# Patient Record
Sex: Male | Born: 1970 | Race: White | Hispanic: No | Marital: Married | Smoking: Current every day smoker
Health system: Southern US, Community
[De-identification: ages and names within clinical notes are randomized; demographics above are authoritative.]

## PROBLEM LIST (undated history)

## (undated) DIAGNOSIS — I10 Essential (primary) hypertension: Secondary | ICD-10-CM

## (undated) DIAGNOSIS — E119 Type 2 diabetes mellitus without complications: Secondary | ICD-10-CM

## (undated) HISTORY — PX: KNEE SURGERY: SHX244

## (undated) HISTORY — PX: SHOULDER SURGERY: SHX246

---

## 2002-10-25 ENCOUNTER — Emergency Department (HOSPITAL_COMMUNITY): Admission: EM | Admit: 2002-10-25 | Discharge: 2002-10-25 | Payer: Self-pay | Admitting: Emergency Medicine

## 2004-08-08 ENCOUNTER — Ambulatory Visit: Admission: RE | Admit: 2004-08-08 | Discharge: 2004-08-08 | Payer: Self-pay | Admitting: Family Medicine

## 2004-08-24 ENCOUNTER — Ambulatory Visit: Payer: Self-pay | Admitting: Pulmonary Disease

## 2008-12-29 ENCOUNTER — Ambulatory Visit (HOSPITAL_COMMUNITY): Admission: RE | Admit: 2008-12-29 | Discharge: 2008-12-29 | Payer: Self-pay | Admitting: Orthopaedic Surgery

## 2010-08-12 LAB — CBC
MCHC: 34.4 g/dL (ref 30.0–36.0)
MCV: 97.1 fL (ref 78.0–100.0)
Platelets: 182 10*3/uL (ref 150–400)
RDW: 13.2 % (ref 11.5–15.5)
WBC: 9.5 10*3/uL (ref 4.0–10.5)

## 2010-08-12 LAB — BASIC METABOLIC PANEL
BUN: 11 mg/dL (ref 6–23)
Chloride: 101 mEq/L (ref 96–112)
Creatinine, Ser: 0.86 mg/dL (ref 0.4–1.5)
Glucose, Bld: 93 mg/dL (ref 70–99)

## 2010-09-19 NOTE — Op Note (Signed)
NAME:  Shaun Williams, Shaun Williams            ACCOUNT NO.:  [id number]   MEDICAL RECORD NO.:  [id number]          PATIENT TYPE:  AMB   LOCATION:  SDS                          FACILITY:  [facility]   PHYSICIAN:  Mark C. Ophelia Charter, M.D.    DATE OF BIRTH:  [birth year]   DATE OF PROCEDURE:  12/29/2008  DATE OF DISCHARGE:  12/29/2008                               OPERATIVE REPORT   PREOPERATIVE DIAGNOSIS:  Chondromalacia, left knee with medial meniscal  tear.   POSTOPERATIVE DIAGNOSIS:  Chondromalacia, left knee with medial meniscal  tear.   PROCEDURE:  Diagnostic and operative arthroscopy, left knee,  tricompartmental debridement with partial medial meniscectomy.   SURGEON:  Mark C. Ophelia Charter, MD.   ANESTHESIA:  MOA plus preoperative knee block, MAC.   PROCEDURE:  After induction of anesthesia, proximal thigh leg holder,  standard prepping and draping, leg holder for the opposite leg.  DuraPrep was used.  The usual lower extremity sheets, drapes, impervious  stockinette, Coban, arthroscopic sheet and pads were applied.  Inflow  was placed through superolateral portal.  After time-out of the  procedure was completed, 70 cm pop medial and lateral parapatellar  tendon port was used for scope and probe placement.  Patellofemoral  joint showed some grade 3 changes, no grade 4 on the patella which is  more on the lateral facet than medial.  There were some loose cartilage  pieces floating in the suprapatellar pouch which were debrided with a  Kuda shaver.  Medial and lateral gutters were clear of loose bodies.  ACL showed trace laxity and there were some slight detachments at the  anterior portion of the lateral meniscus and ACL at the anterior portion  of the tibia and it would peel back with some separation.  No  undersurface tear of the lateral meniscus.  There were some fraying on  the edge, which was slightly trimmed.  Knee was flexed and extended and  the lateral compartment was relatively spared.   Patellofemoral joint  showed significant grade 3 changes and a large area involving the  lateral facet and a shaver was introduced with extensive debridement and  smoothing of these areas.  Medial compartment shows extensive damage on  the medial femoral condyle which involve the weightbearing surface  involving the posterior aspect with contact for the knee to flex 90  degrees all the way out into full extension.  There are multiple flap  tears with tense pieces of cartilage which were trimmed back and a  complex tear of the posterior medial meniscus which was trimmed back  with straight small flat baskets followed by the 4.2 shaver, smoothing  the rim.  The remaining rim was intact, anterior portion and midportion  of the medial meniscus was intact.  Tibia showed some grade 2 and some  spotty areas of grade 3 changes with less destruction then on the medial  femoral condyle.  Two worst areas were the trochlear groove and patella,  the patellofemoral joint and then the medial femoral condyle.  After  thorough irrigation, aspiration, lavage of the remaining cartilage  fragments, final pulling on the meniscus, checking  the cartilage with  flexion, extension and making sure there were no remaining flap tears of  the cartilage that all areas have been trimmed and smoothed to make his  joint surface as smooth as possible.  He was suctioned  dried and nylon sutures were placed in the portals.  Xeroform, Marcaine  infiltration into the knee, 4x4s, Webril, 6-inch Ace wrap x2 for  postoperative dressing.  Instrument count and needle count was correct.  Office followup in 1 week.  Outpatient surgery was appropriate for  treatment of this condition.      Mark C. Ophelia Charter, M.D.  Electronically Signed     MCY/MEDQ  D:  12/29/2008  T:  12/30/2008  Job:  [id number]

## 2010-09-22 NOTE — Procedures (Signed)
NAME:  Shaun Williams, Shaun Williams            ACCOUNT NO.:  [id number]   MEDICAL RECORD NO.:  [id number]          PATIENT TYPE:  OUT   LOCATION:  SLEEP LAB                     FACILITY:  [facility]   PHYSICIAN:  Marcelyn Bruins, M.D. [facility] DATE OF BIRTH:  Mar 31, 1971   DATE OF STUDY:  08/08/2004                              NOCTURNAL POLYSOMNOGRAM   DATE OF STUDY:  August 08, 2004   REFERRING PHYSICIAN:  Dr. Lilyan Punt   INDICATION FOR THE STUDY:  Hypersomnia with sleep apnea.   SLEEP ARCHITECTURE:  The patient had a total sleep time of 323 minutes with  significantly decreased REM. Sleep onset latency was normal and REM onset  was very prolonged.   IMPRESSION:  1.  Split-night study reveals severe obstructive sleep apnea with 265      obstructive events noted in the first 147 minutes of sleep. This gave      the patient a respiratory disturbance index of 109 events per hour with      O2 desaturation as low as 81%. Events were not positional. Very loud      snoring was noted during this time. By protocol the patient was placed      on a medium ComfortGel continuous positive airway pressure mask and      continuous positive airway pressure titration was initiated. At a      pressure of 11 cm there appeared to be excellent control of his      obstructive events and also snoring. Given the patient's severe sleep      apnea, continuous positive airway pressure coupled with weight loss      would be his  best treatment option.  2.  No clinically significant cardiac arrhythmias.      KC/MEDQ  D:  08/24/2004 13:45:43  T:  08/24/2004 19:41:35  Job:  [id number]

## 2012-08-28 ENCOUNTER — Emergency Department (HOSPITAL_COMMUNITY)
Admission: EM | Admit: 2012-08-28 | Discharge: 2012-08-29 | Disposition: A | Discharge status: Home / Self Care | Payer: BC Managed Care – PPO | Attending: Emergency Medicine | Admitting: Emergency Medicine

## 2012-08-28 ENCOUNTER — Emergency Department (HOSPITAL_COMMUNITY): Payer: BC Managed Care – PPO

## 2012-08-28 ENCOUNTER — Encounter (HOSPITAL_COMMUNITY): Payer: Self-pay | Admitting: *Deleted

## 2012-08-28 DIAGNOSIS — I10 Essential (primary) hypertension: Secondary | ICD-10-CM | POA: Insufficient documentation

## 2012-08-28 DIAGNOSIS — G510 Bell's palsy: Secondary | ICD-10-CM | POA: Insufficient documentation

## 2012-08-28 DIAGNOSIS — F17200 Nicotine dependence, unspecified, uncomplicated: Secondary | ICD-10-CM | POA: Insufficient documentation

## 2012-08-28 HISTORY — DX: Essential (primary) hypertension: I10

## 2012-08-28 LAB — CBC WITH DIFFERENTIAL/PLATELET
Basophils Absolute: 0 10*3/uL (ref 0.0–0.1)
Basophils Relative: 0 % (ref 0–1)
Eosinophils Absolute: 0.2 10*3/uL (ref 0.0–0.7)
MCH: 34.2 pg — ABNORMAL HIGH (ref 26.0–34.0)
MCHC: 35.8 g/dL (ref 30.0–36.0)
Neutro Abs: 5.7 10*3/uL (ref 1.7–7.7)
Neutrophils Relative %: 45 % (ref 43–77)
Platelets: 170 10*3/uL (ref 150–400)
RDW: 13.4 % (ref 11.5–15.5)

## 2012-08-28 LAB — BASIC METABOLIC PANEL
Chloride: 100 mEq/L (ref 96–112)
GFR calc Af Amer: >90 mL/min (ref >90)
GFR calc non Af Amer: >90 mL/min (ref >90)
Potassium: 3.2 mEq/L — ABNORMAL LOW (ref 3.5–5.1)

## 2012-08-28 LAB — GLUCOSE, CAPILLARY: Glucose-Capillary: 112 mg/dL — ABNORMAL HIGH (ref 70–99)

## 2012-08-28 MED ORDER — SODIUM CHLORIDE 0.9 % IV BOLUS (SEPSIS)
1000.0000 mL | Freq: Once | INTRAVENOUS | Status: AC
  Administered 2012-08-28: 1000 mL via INTRAVENOUS

## 2012-08-28 NOTE — ED Provider Notes (Addendum)
History    This chart was scribed for [employer]. Colon Branch, MD by Marlyne Beards, ED Scribe. The patient was seen in room APA18/APA18. Patient's care was started at 11:14 PM.    CSN: [id number]  Arrival date & time 08/28/12  2246   First MD Initiated Contact with Patient 08/28/12 2314      No chief complaint on file.   (Consider location/radiation/quality/duration/timing/severity/associated sxs/prior treatment) The history is provided by the patient. No language interpreter was used.   Shaun Williams is a 42 y.o. male with h/o HTN who presents to the Emergency Department complaining of moderate constant numbness to the right side of pt's face and arm with associated tingling on right side of body onset earlier this evening. Pt states that the numbness started down the right side of his body. Pt states he was relaxing this evening when he started drooling from the right side of his face. Pt states that he drank 5-6 shots earlier this evening. Pt took otc aspirin earlier at 10PM. Pt denies headache, fever, chills, cough, nausea, vomiting, diarrhea, SOB, and any other associated symptoms. Pt is a current everyday tobacco smoker.    Past Medical History  Diagnosis Date  . Hypertension     Past Surgical History  Procedure Laterality Date  . Shoulder surgery    . Knee surgery      History reviewed. No pertinent family history.  History  Substance Use Topics  . Smoking status: Current Every Day Smoker  . Smokeless tobacco: Not on file  . Alcohol Use: Yes      Review of Systems  Neurological: Positive for facial asymmetry.       Subjective feeling of numbness  All other systems reviewed and are negative.    Allergies  Review of patient's allergies indicates no known allergies.  Home Medications  No current outpatient prescriptions on file.  BP 127/77  Pulse 85  Temp(Src) 98 F (36.7 C) (Oral)  Resp 20  Ht 5\' 11"  (1.803 m)  Wt 290 lb (131.543 kg)  BMI 40.46 kg/m2   SpO2 98%  Physical Exam  Nursing note and vitals reviewed. Constitutional: He is oriented to person, place, and time. He appears well-developed and well-nourished. No distress.  HENT:  Head: Normocephalic and atraumatic.  Eyes: EOM are normal.  Neck: Neck supple. No tracheal deviation present.  Cardiovascular: Normal rate and normal heart sounds.   No murmur heard. Pulmonary/Chest: Effort normal. No respiratory distress.  Musculoskeletal: Normal range of motion.  Strength is normal and he is moving all extremities   Neurological: He is alert and oriented to person, place, and time. A cranial nerve deficit is present.  Slight right sided facial droop. His right eye does not close completely. Tongue is midline. Able to talk without slurring speech.   Skin: Skin is warm and dry.  Psychiatric: He has a normal mood and affect. His behavior is normal.    ED Course  Procedures (including critical care time) Results for orders placed during the hospital encounter of 08/28/12  GLUCOSE, CAPILLARY      Result Value Range   Glucose-Capillary 112 (*) 70 - 99 mg/dL  CBC WITH DIFFERENTIAL      Result Value Range   WBC 12.6 (*) 4.0 - 10.5 K/uL   RBC 4.73  4.22 - 5.81 MIL/uL   Hemoglobin 16.2  13.0 - 17.0 g/dL   HCT 40.9  81.1 - 91.4 %   MCV 95.6  78.0 - 100.0 fL  MCH 34.2 (*) 26.0 - 34.0 pg   MCHC 35.8  30.0 - 36.0 g/dL   RDW 16.1  09.6 - 04.5 %   Platelets 170  150 - 400 K/uL   Neutrophils Relative 45  43 - 77 %   Neutro Abs 5.7  1.7 - 7.7 K/uL   Lymphocytes Relative 46  12 - 46 %   Lymphs Abs 5.8 (*) 0.7 - 4.0 K/uL   Monocytes Relative 7  3 - 12 %   Monocytes Absolute 0.9  0.1 - 1.0 K/uL   Eosinophils Relative 2  0 - 5 %   Eosinophils Absolute 0.2  0.0 - 0.7 K/uL   Basophils Relative 0  0 - 1 %   Basophils Absolute 0.0  0.0 - 0.1 K/uL  BASIC METABOLIC PANEL      Result Value Range   Sodium 140  135 - 145 mEq/L   Potassium 3.2 (*) 3.5 - 5.1 mEq/L   Chloride 100  96 - 112 mEq/L    CO2 25  19 - 32 mEq/L   Glucose, Bld 107 (*) 70 - 99 mg/dL   BUN 13  6 - 23 mg/dL   Creatinine, Ser 4.09  0.50 - 1.35 mg/dL   Calcium 9.3  8.4 - 81.1 mg/dL   GFR calc non Af Amer >90  >90 mL/min   GFR calc Af Amer >90  >90 mL/min  ETHANOL      Result Value Range   Alcohol, Ethyl (B) 205 (*) 0 - 11 mg/dL   DIAGNOSTIC STUDIES: Oxygen Saturation is 98% on room air, normal by my interpretation.    COORDINATION OF CARE: 11:24 PM Discussed ED treatment with pt and pt agrees.   Ct Head Wo Contrast  08/29/2012  *RADIOLOGY REPORT*  Clinical Data: Severe headache and right facial numbness.  Right hand numbness.  Hypertension.  CT HEAD WITHOUT CONTRAST  Technique:  Contiguous axial images were obtained from the base of the skull through the vertex without contrast.  Comparison: None  Findings: Bone windows demonstrate clear paranasal sinuses and mastoid air cells.  Soft tissue windows demonstrate no  mass lesion, hemorrhage, hydrocephalus, acute infarct, intra-axial, or extra-axial fluid collection.  IMPRESSION: Normal head CT.   Original Report Authenticated By: Jeronimo Greaves, M.D.     MDM  Patient with right sided facial numbness, drooling, and inability to close his right eye. He has been drinking.(ETOH 205) He states he now has right sided arm and hand weakness although PE is normal to right arm and hand. Right facial has a mild droop , he is unable to close his eye. CT of head is normal. He would seem to have a Bell's palsy. Given Solumedrol 125 mg. Reviewed results with patient. Pt stable in ED with no significant deterioration in condition.The patient appears reasonably screened and/or stabilized for discharge and I doubt any other medical condition or other [facility] requiring further screening, evaluation, or treatment in the ED at this time prior to discharge.   I personally performed the services described in this documentation, which was scribed in my presence. The recorded information has been  reviewed and considered.  MDM Reviewed: nursing note and vitals Interpretation: labs and CT scan      Nicoletta Dress. Colon Branch, MD 08/29/12 0150  Nicoletta Dress. Colon Branch, MD 08/29/12 [phone]

## 2012-08-28 NOTE — ED Notes (Signed)
Numbness rt side of face, and rt arm.  Tingling rt side of body

## 2012-08-29 LAB — RAPID URINE DRUG SCREEN, HOSP PERFORMED
Amphetamines: NOT DETECTED
Benzodiazepines: NOT DETECTED
Opiates: NOT DETECTED

## 2012-08-29 MED ORDER — METHYLPREDNISOLONE SODIUM SUCC 125 MG IJ SOLR
125.0000 mg | Freq: Once | INTRAMUSCULAR | Status: AC
  Administered 2012-08-29: 125 mg via INTRAVENOUS
  Filled 2012-08-29: qty 2

## 2012-08-29 MED ORDER — PREDNISONE 50 MG PO TABS: ORAL_TABLET | ORAL | Status: DC

## 2012-08-29 NOTE — ED Notes (Addendum)
No change in symptoms upon exam, pt states the tingling in his right hand "comes and goes" intermittently feels like it's "asleep"

## 2012-11-23 ENCOUNTER — Other Ambulatory Visit: Payer: Self-pay | Admitting: Family Medicine

## 2013-01-09 ENCOUNTER — Telehealth: Payer: Self-pay | Admitting: Family Medicine

## 2013-01-09 MED ORDER — AMLODIPINE BESYLATE 10 MG PO TABS: ORAL_TABLET | ORAL | Status: DC

## 2013-01-09 MED ORDER — LISINOPRIL-HYDROCHLOROTHIAZIDE 20-25 MG PO TABS: ORAL_TABLET | ORAL | Status: AC

## 2013-01-09 NOTE — Telephone Encounter (Signed)
On last refill he was told must have office visit before further refills. Hasn't been seen since 11/13- Advised patient i could schedule him a office visit for next week with the doctor and give him a seven day suppy of his meds- Patient got upset and said he didn't see why he should have to pay $130 or more dollars just to get refills on his meds- patient told me to call in the seven days worth of meds but didn't make an appt. Do you want me to call in the seven days of meds

## 2013-01-09 NOTE — Telephone Encounter (Signed)
yep

## 2013-01-09 NOTE — Telephone Encounter (Signed)
7 days of med sent electronically to [facility]. Patient aware.

## 2013-01-09 NOTE — Telephone Encounter (Signed)
Patient needs Rx for lisinopril and amlodipine    [facility]

## 2017-10-17 DIAGNOSIS — Z681 Body mass index (BMI) 19 or less, adult: Secondary | ICD-10-CM | POA: Diagnosis not present

## 2017-10-17 DIAGNOSIS — Z0000 Encounter for general adult medical examination without abnormal findings: Secondary | ICD-10-CM | POA: Diagnosis not present

## 2019-05-05 DIAGNOSIS — E1065 Type 1 diabetes mellitus with hyperglycemia: Secondary | ICD-10-CM | POA: Diagnosis not present

## 2019-05-05 DIAGNOSIS — E1165 Type 2 diabetes mellitus with hyperglycemia: Secondary | ICD-10-CM | POA: Diagnosis not present

## 2019-05-05 DIAGNOSIS — I10 Essential (primary) hypertension: Secondary | ICD-10-CM | POA: Diagnosis not present

## 2019-05-08 DIAGNOSIS — I639 Cerebral infarction, unspecified: Secondary | ICD-10-CM

## 2019-05-08 HISTORY — DX: Cerebral infarction, unspecified: I63.9

## 2019-05-11 DIAGNOSIS — Z0000 Encounter for general adult medical examination without abnormal findings: Secondary | ICD-10-CM | POA: Diagnosis not present

## 2019-05-20 ENCOUNTER — Other Ambulatory Visit: Payer: Self-pay

## 2019-05-20 ENCOUNTER — Ambulatory Visit: Payer: BC Managed Care – PPO | Attending: Internal Medicine

## 2019-05-20 DIAGNOSIS — Z20822 Contact with and (suspected) exposure to covid-19: Secondary | ICD-10-CM

## 2019-05-21 LAB — NOVEL CORONAVIRUS, NAA: SARS-CoV-2, NAA: NOT DETECTED

## 2019-10-01 ENCOUNTER — Observation Stay (HOSPITAL_COMMUNITY)
Admission: EM | Admit: 2019-10-01 | Discharge: 2019-10-02 | Disposition: A | Discharge status: Home / Self Care | Payer: BC Managed Care – PPO | Attending: Family Medicine | Admitting: Family Medicine

## 2019-10-01 ENCOUNTER — Emergency Department (HOSPITAL_COMMUNITY): Payer: BC Managed Care – PPO

## 2019-10-01 ENCOUNTER — Observation Stay (HOSPITAL_COMMUNITY): Payer: BC Managed Care – PPO

## 2019-10-01 ENCOUNTER — Encounter (HOSPITAL_COMMUNITY): Payer: Self-pay

## 2019-10-01 ENCOUNTER — Other Ambulatory Visit: Payer: Self-pay

## 2019-10-01 DIAGNOSIS — R29818 Other symptoms and signs involving the nervous system: Secondary | ICD-10-CM | POA: Insufficient documentation

## 2019-10-01 DIAGNOSIS — Z20822 Contact with and (suspected) exposure to covid-19: Secondary | ICD-10-CM | POA: Insufficient documentation

## 2019-10-01 DIAGNOSIS — R531 Weakness: Secondary | ICD-10-CM | POA: Diagnosis not present

## 2019-10-01 DIAGNOSIS — I6300 Cerebral infarction due to thrombosis of unspecified precerebral artery: Secondary | ICD-10-CM | POA: Diagnosis not present

## 2019-10-01 DIAGNOSIS — F17200 Nicotine dependence, unspecified, uncomplicated: Secondary | ICD-10-CM | POA: Insufficient documentation

## 2019-10-01 DIAGNOSIS — I10 Essential (primary) hypertension: Secondary | ICD-10-CM | POA: Diagnosis not present

## 2019-10-01 DIAGNOSIS — E109 Type 1 diabetes mellitus without complications: Secondary | ICD-10-CM | POA: Insufficient documentation

## 2019-10-01 DIAGNOSIS — R200 Anesthesia of skin: Secondary | ICD-10-CM | POA: Diagnosis not present

## 2019-10-01 DIAGNOSIS — E119 Type 2 diabetes mellitus without complications: Principal | ICD-10-CM | POA: Insufficient documentation

## 2019-10-01 DIAGNOSIS — G459 Transient cerebral ischemic attack, unspecified: Secondary | ICD-10-CM | POA: Diagnosis present

## 2019-10-01 HISTORY — DX: Type 2 diabetes mellitus without complications: E11.9

## 2019-10-01 LAB — CBC
HCT: 50.2 % (ref 39.0–52.0)
Hemoglobin: 16.7 g/dL (ref 13.0–17.0)
MCH: 33.2 pg (ref 26.0–34.0)
MCHC: 33.3 g/dL (ref 30.0–36.0)
MCV: 99.8 fL (ref 80.0–100.0)
Platelets: 167 10*3/uL (ref 150–400)
RBC: 5.03 MIL/uL (ref 4.22–5.81)
RDW: 13.5 % (ref 11.5–15.5)
WBC: 10.6 10*3/uL — ABNORMAL HIGH (ref 4.0–10.5)
nRBC: 0 % (ref 0.0–0.2)

## 2019-10-01 LAB — DIFFERENTIAL
Abs Immature Granulocytes: 0.03 10*3/uL (ref 0.00–0.07)
Basophils Absolute: 0.1 10*3/uL (ref 0.0–0.1)
Basophils Relative: 1 %
Eosinophils Absolute: 0.2 10*3/uL (ref 0.0–0.5)
Eosinophils Relative: 2 %
Immature Granulocytes: 0 %
Lymphocytes Relative: 29 %
Lymphs Abs: 3.1 10*3/uL (ref 0.7–4.0)
Monocytes Absolute: 0.8 10*3/uL (ref 0.1–1.0)
Monocytes Relative: 8 %
Neutro Abs: 6.4 10*3/uL (ref 1.7–7.7)
Neutrophils Relative %: 60 %

## 2019-10-01 LAB — COMPREHENSIVE METABOLIC PANEL
ALT: 26 U/L (ref 0–44)
AST: 17 U/L (ref 15–41)
Albumin: 4.1 g/dL (ref 3.5–5.0)
Alkaline Phosphatase: 51 U/L (ref 38–126)
Anion gap: 12 (ref 5–15)
BUN: 17 mg/dL (ref 6–20)
CO2: 24 mmol/L (ref 22–32)
Calcium: 8.9 mg/dL (ref 8.9–10.3)
Chloride: 100 mmol/L (ref 98–111)
Creatinine, Ser: 0.87 mg/dL (ref 0.61–1.24)
GFR calc Af Amer: >60 mL/min (ref >60)
GFR calc non Af Amer: >60 mL/min (ref >60)
Glucose, Bld: 163 mg/dL — ABNORMAL HIGH (ref 70–99)
Potassium: 3.2 mmol/L — ABNORMAL LOW (ref 3.5–5.1)
Sodium: 136 mmol/L (ref 135–145)
Total Bilirubin: 0.6 mg/dL (ref 0.3–1.2)
Total Protein: 7.3 g/dL (ref 6.5–8.1)

## 2019-10-01 LAB — CBG MONITORING, ED: Glucose-Capillary: 148 mg/dL — ABNORMAL HIGH (ref 70–99)

## 2019-10-01 LAB — ETHANOL: Alcohol, Ethyl (B): <10 mg/dL (ref <10)

## 2019-10-01 LAB — PROTIME-INR
INR: 1 (ref 0.8–1.2)
Prothrombin Time: 12.5 seconds (ref 11.4–15.2)

## 2019-10-01 LAB — SARS CORONAVIRUS 2 BY RT PCR (HOSPITAL ORDER, PERFORMED IN ~~LOC~~ HOSPITAL LAB): SARS Coronavirus 2: NEGATIVE

## 2019-10-01 LAB — APTT: aPTT: 29 seconds (ref 24–36)

## 2019-10-01 MED ORDER — ASPIRIN 81 MG PO CHEW
324.0000 mg | CHEWABLE_TABLET | Freq: Once | ORAL | Status: AC
  Administered 2019-10-01: 324 mg via ORAL
  Filled 2019-10-01: qty 4

## 2019-10-01 MED ORDER — NICOTINE 21 MG/24HR TD PT24
21.0000 mg | MEDICATED_PATCH | Freq: Every day | TRANSDERMAL | Status: DC
  Administered 2019-10-01 – 2019-10-02 (×2): 21 mg via TRANSDERMAL
  Filled 2019-10-01: qty 1

## 2019-10-01 MED ORDER — GADOBUTROL 1 MMOL/ML IV SOLN
10.0000 mL | Freq: Once | INTRAVENOUS | Status: AC | PRN
  Administered 2019-10-01: 10 mL via INTRAVENOUS

## 2019-10-01 MED ORDER — ASPIRIN 325 MG PO TABS
325.0000 mg | ORAL_TABLET | Freq: Every day | ORAL | Status: DC
  Administered 2019-10-02: 325 mg via ORAL
  Filled 2019-10-01: qty 1

## 2019-10-01 MED ORDER — NICOTINE 21 MG/24HR TD PT24
MEDICATED_PATCH | TRANSDERMAL | Status: AC
  Filled 2019-10-01: qty 1

## 2019-10-01 NOTE — ED Notes (Signed)
CODE STROKE PAGED @ 1900

## 2019-10-01 NOTE — Consult Note (Signed)
[facility] Tele[facility]rology Consult Services   Date of Service:   10/01/2019 19:10:09  Comments/Sign-Out: 26 M, h/o HTN, here with R sided numbness and R leg wkness. NIHSS 2, not an alteplase candidate as he is outside 4.5 hr window. Sx concerning for brain ischemia. Head CT neg for acute abnl.   PLAN  - MRI brain w/o contrast  - MRA head/neck w/ and w/o contrast  - TTE w/bubble  - check a1c and LDL  - monitor on tele for afib  - neuro to follow  --  Metrics: Last Known Well: 10/01/2019 14:00:00 [facility] Notification Time: 10/01/2019 19:10:09 Arrival Time: 10/01/2019 18:50:00 Stamp Time: 10/01/2019 19:10:09 Time First Login Attempt: 10/01/2019 19:13:14 Symptoms: right sided numbness/wkness NIHSS Start Assessment Time: 10/01/2019 19:18:30 Patient is not a candidate for Alteplase/Activase. Alteplase Medical Decision: 10/01/2019 19:18:36 Patient was not deemed candidate for Alteplase/Activase thrombolytics because of following reasons: Last Well Known Above 4.5 Hours.  CT head showed no acute hemorrhage or acute core infarct.  Clinical Presentation is not Suggestive of Large Vessel Occlusive Disease  Sign Out:     .  Discussed with Emergency Department Provider  ------------------------------------------------------------------------------  History of Present Illness: Patient is a 49 year old Male.  Patient was brought by private transportation with symptoms of right sided numbness/wkness  67 M, h/o HTN, who was LKW at 2 PM today when he was at work, he developed new onset R face/arm/leg numbness. He works as a Furniture conservator/restorer. Sx persisted into the evening so he came to ER for eval where he was also found to have some R leg wkness as well. He reports that he can walk normally. On my assessment, NIHSS is 2, 1 pt for RLE drift, 1 pt for R sided numbness. No history of stroke in the past.   Examination: BP(128/85), Pulse(89), Blood  Glucose(148) 1A: Level of Consciousness - Alert; keenly responsive + 0 1B: Ask Month and Age - Both Questions Right + 0 1C: Blink Eyes & Squeeze Hands - Performs Both Tasks + 0 2: Test Horizontal Extraocular Movements - Normal + 0 3: Test Visual Fields - No Visual Loss + 0 4: Test Facial Palsy (Use Grimace if Obtunded) - Normal symmetry + 0 5A: Test Left Arm Motor Drift - No Drift for 10 Seconds + 0 5B: Test Right Arm Motor Drift - No Drift for 10 Seconds + 0 6A: Test Left Leg Motor Drift - No Drift for 5 Seconds + 0 6B: Test Right Leg Motor Drift - Drift, but doesn't hit bed + 1 7: Test Limb Ataxia (FNF/Heel-Shin) - No Ataxia + 0 8: Test Sensation - Mild-Moderate Loss: Less Sharp/More Dull + 1 9: Test Language/Aphasia - Normal; No aphasia + 0 10: Test Dysarthria - Normal + 0 11: Test Extinction/Inattention - No abnormality + 0  NIHSS Score: 2  Pre-Morbid Modified Ranking Scale: 0 Points = No symptoms at all   Patient/Family was informed the Neurology Consult would occur via TeleHealth consult by way of interactive audio and video telecommunications and consented to receiving care in this manner.   Patient is being evaluated for possible acute neurologic impairment and high probability of imminent or life-threatening deterioration. I spent total of 15 minutes providing care to this patient, including time for face to face visit via telemedicine, review of medical records, imaging studies and discussion of findings with providers, the patient and/or family.   Dr Othelia Pulling   [facility] [phone]  Case [id number]

## 2019-10-01 NOTE — ED Notes (Signed)
Pt transported to MRI 

## 2019-10-01 NOTE — Progress Notes (Signed)
CODE STROKE 1901 call time,beeper time 1913 exam started 1915 exam ended 1917 exam complete in Epic, [facility] called, spoke with Shaun Williams

## 2019-10-01 NOTE — ED Triage Notes (Signed)
Pt states he arrived to work today at 2 pm and started feeling numbness and tingling in the right side of his face, right hand, right leg with weakness to right leg. Pt is otherwise awake, alert, oriented, denies pain

## 2019-10-01 NOTE — ED Notes (Signed)
2129-patient roomed from MRI.

## 2019-10-01 NOTE — ED Provider Notes (Signed)
[facility] EMERGENCY DEPARTMENT Provider Note   CSN: [id number] Arrival date & time: 10/01/19  1850     History Chief Complaint  Patient presents with  . Code Stroke    Shaun Williams is a 49 y.o. male.  Patient complains of right-sided facial numbness and right arm numbness and right leg numbness and weakness.  The history is provided by the patient. No language interpreter was used.  Altered Mental Status Presenting symptoms: no behavior changes   Severity:  Mild Most recent episode:  Today Episode history:  Single Timing:  Constant Progression:  Worsening Chronicity:  New Context: not alcohol use   Associated symptoms: no hallucinations, no headaches, no rash and no seizures        Past Medical History:  Diagnosis Date  . Diabetes mellitus without complication (HCC)   . Hypertension     There are no problems to display for this patient.   Past Surgical History:  Procedure Laterality Date  . KNEE SURGERY    . SHOULDER SURGERY         No family history on file.  Social History   Tobacco Use  . Smoking status: Current Every Day Smoker  Substance Use Topics  . Alcohol use: Yes  . Drug use: No    Home Medications Prior to Admission medications   Medication Sig Start Date End Date Taking? Authorizing Provider  amLODipine (NORVASC) 10 MG tablet TAKE ONE TABLET BY MOUTH EVERY DAY 01/09/13   Merlyn Albert, MD  lisinopril-hydrochlorothiazide (PRINZIDE,ZESTORETIC) 20-25 MG per tablet TAKE ONE TABLET BY MOUTH ONCE EVERY DAY 01/09/13   Merlyn Albert, MD  predniSONE (DELTASONE) 50 MG tablet Take one tablet daily for 10 days. 08/29/12   Annamarie Dawley, MD    Allergies    Patient has no known allergies.  Review of Systems   Review of Systems  Constitutional: Negative for appetite change and fatigue.  HENT: Negative for congestion, ear discharge and sinus pressure.   Eyes: Negative for discharge.  Respiratory: Negative for cough.     Cardiovascular: Negative for chest pain.  Gastrointestinal: Negative for diarrhea.  Genitourinary: Negative for frequency and hematuria.  Musculoskeletal: Negative for back pain.  Skin: Negative for rash.  Neurological: Negative for seizures and headaches.       Right side facial numbness with numbness right arm and numbness right leg with weakness  Psychiatric/Behavioral: Negative for hallucinations.    Physical Exam Updated Vital Signs BP 118/73   Pulse 86   Temp 98.1 F (36.7 C) (Oral)   Resp 20   Ht 5\' 10"  (1.778 m)   Wt 111 kg   SpO2 94%   BMI 35.11 kg/m   Physical Exam Vitals and nursing note reviewed.  Constitutional:      Appearance: He is well-developed.  HENT:     Head: Normocephalic.     Nose: Nose normal.  Eyes:     General: No scleral icterus.    Conjunctiva/sclera: Conjunctivae normal.  Neck:     Thyroid: No thyromegaly.  Cardiovascular:     Rate and Rhythm: Normal rate and regular rhythm.     Heart sounds: No murmur. No friction rub. No gallop.   Pulmonary:     Breath sounds: No stridor. No wheezing or rales.  Chest:     Chest wall: No tenderness.  Abdominal:     General: There is no distension.     Tenderness: There is no abdominal tenderness. There is no rebound.  Musculoskeletal:     Cervical back: Neck supple.     Comments: Facial numbness and right arm and right leg mildly weak  Lymphadenopathy:     Cervical: No cervical adenopathy.  Skin:    Findings: No erythema or rash.  Neurological:     Mental Status: He is alert and oriented to person, place, and time.     Motor: No abnormal muscle tone.     Coordination: Coordination normal.  Psychiatric:        Behavior: Behavior normal.     ED Results / Procedures / Treatments   Labs (all labs ordered are listed, but only abnormal results are displayed) Labs Reviewed  CBC - Abnormal; Notable for the following components:      Result Value   WBC 10.6 (*)    All other components within  normal limits  COMPREHENSIVE METABOLIC PANEL - Abnormal; Notable for the following components:   Potassium 3.2 (*)    Glucose, Bld 163 (*)    All other components within normal limits  CBG MONITORING, ED - Abnormal; Notable for the following components:   Glucose-Capillary 148 (*)    All other components within normal limits  SARS CORONAVIRUS 2 BY RT PCR (HOSPITAL ORDER, [place] [facility])  ETHANOL  PROTIME-INR  APTT  DIFFERENTIAL  RAPID URINE DRUG SCREEN, HOSP PERFORMED  URINALYSIS, ROUTINE W REFLEX MICROSCOPIC    EKG None  Radiology CT HEAD CODE STROKE WO CONTRAST  Result Date: 10/01/2019 CLINICAL DATA:  Code stroke.  Right-sided numbness and tingling EXAM: CT HEAD WITHOUT CONTRAST TECHNIQUE: Contiguous axial images were obtained from the base of the skull through the vertex without intravenous contrast. COMPARISON:  2014 FINDINGS: Brain: No acute intracranial hemorrhage, mass effect, or edema. Gray-white differentiation is preserved. Ventricles and sulci are normal in size and configuration. There is no extra-axial fluid collection Vascular: No hyperdense vessel. There is intracranial atherosclerotic calcification at the skull base. Skull: Unremarkable Sinuses/Orbits: Aerated.  Orbits are unremarkable. Other: Mastoid air cells are clear ASPECTS [facility] Stroke Program Early CT Score) - Ganglionic level infarction (caudate, lentiform nuclei, internal capsule, insula, M1-M3 cortex): 7 - Supraganglionic infarction (M4-M6 cortex): 3 Total score (0-10 with 10 being normal): 10 IMPRESSION: No acute intracranial hemorrhage or evidence of acute infarction. ASPECT score is 10. These results were called by telephone at the time of interpretation on 10/01/2019 at 7:25 pm to provider [facility] Cyan Moultrie , who verbally acknowledged these results. Electronically Signed   By: Macy Mis M.D.   On: 10/01/2019 19:25    Procedures Procedures (including critical care time)  Medications  Ordered in ED Medications  aspirin chewable tablet 324 mg (has no administration in time range)    ED Course  I have reviewed the triage vital signs and the nursing notes.  Pertinent labs & imaging results that were available during my care of the patient were reviewed by me and considered in my medical decision making (see chart for details).    CRITICAL CARE Performed by: Milton Ferguson Total critical care time: 40 minutes Critical care time was exclusive of separately billable procedures and treating other patients. Critical care was necessary to treat or prevent imminent or life-threatening deterioration. Critical care was time spent personally by me on the following activities: development of treatment plan with patient and/or surrogate as well as nursing, discussions with consultants, evaluation of patient's response to treatment, examination of patient, obtaining history from patient or surrogate, ordering and performing treatments and interventions,  ordering and review of laboratory studies, ordering and review of radiographic studies, pulse oximetry and re-evaluation of patient's condition.  MDM Rules/Calculators/A&P                      Patient with stroke symptoms.  He was seen by telemetry neurology and they want him admitted for stroke work-up with MRI.      This patient presents to the ED for concern of facial numbness and arm numbness this involves an extensive number of treatment options, and is a complaint that carries with it a high risk of complications and morbidity.  The differential diagnosis includes stroke complicated migraine   Lab Tests:   I Ordered, reviewed, and interpreted labs, which included CBC and chemistries which show mild hypokalemia  Medicines ordered:   I ordered medication aspirin for stroke  Imaging Studies ordered:   I ordered imaging studies which included CT head and  I independently visualized and interpreted imaging which showed  no acute stroke  Additional history obtained:   Additional history obtained from EMS  Previous records obtained and reviewed   Consultations Obtained:   I consulted neurologist and hospitalist and discussed lab and imaging findings  Reevaluation:  After the interventions stated above, I reevaluated the patient and found mild improvement  Critical Interventions:  .   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

## 2019-10-01 NOTE — H&P (Signed)
TRH H&P   Patient Demographics:    Shaun Williams, is a 49 y.o. male  MRN: [id number]   DOB - 11-28-1970  Admit Date - 10/01/2019  Outpatient Primary MD for the patient is Denny Levy, [place]  Referring MD/NP/PA: Dr Roderic Palau.   Patient coming from: Home.  Chief Complaint  Patient presents with  . Code Stroke      HPI:    Shaun Williams  is a 49 y.o. male, with past medical history of diabetes mellitus, hypertension, hyperlipidemia, obesity (lost significant weight and hyperlipidemia has improved when he stopped his statin), tobacco abuse, patient presents to ED secondary to new onset right facial/arm/leg numbness, as well he does report some right sided weakness as well, reports started at 2 PM, but then persisted through the day which prompted him to come to ED, code stroke was called, patient was noted to have right lower extremity drift, therefore TPA given he passed 4.5 hours of his symptoms, patient denies chest pain, shortness of breath, fever, chills. ED course -In ED CT head was with no acute finding, teleneurology assessment NIHSS is 2, 1 pt for RLE drift, 1 pt for R sided numbness, recommendation for admission for acute CVA work-up, transhospitalist consulted to admit.    Review of systems:    In addition to the HPI above No Fever-chills, No Headache, No changes with Vision or hearing, No problems swallowing food or Liquids, No Chest pain, Cough or Shortness of Breath, No Abdominal pain, No Nausea or Vommitting, Bowel movements are regular, No Blood in stool or Urine, No dysuria, No new skin rashes or bruises, No new joints pains-aches,  Reports right facial and right side tingling numbness, and some mild weakness No recent weight gain or loss, No polyuria, polydypsia or polyphagia, No significant Mental Stressors.  A full 10 point Review of Systems was done,  except as stated above, all other Review of Systems were negative.   With Past History of the following :    Past Medical History:  Diagnosis Date  . Diabetes mellitus without complication ([place])   . Hypertension       Past Surgical History:  Procedure Laterality Date  . KNEE SURGERY    . SHOULDER SURGERY        Social History:     Social History   Tobacco Use  . Smoking status: Current Every Day Smoker  Substance Use Topics  . Alcohol use: Yes       Family History :   Family history was reviewed, nonpertinent.   Home Medications:   Prior to Admission medications   Medication Sig Start Date End Date Taking? Authorizing Provider  JARDIANCE 25 MG TABS tablet Take 25 mg by mouth daily. 08/11/19  Yes [provider]  lisinopril-hydrochlorothiazide (PRINZIDE,ZESTORETIC) 20-25 MG per tablet TAKE ONE TABLET BY MOUTH ONCE EVERY DAY Patient taking differently: Take 1 tablet by  mouth daily. TAKE ONE TABLET BY MOUTH ONCE EVERY DAY 01/09/13  Yes Merlyn Albert, MD  amLODipine (NORVASC) 10 MG tablet TAKE ONE TABLET BY MOUTH EVERY DAY Patient not taking: Reported on 10/01/2019 01/09/13   Merlyn Albert, MD     Allergies:    No Known Allergies   Physical Exam:   Vitals  Blood pressure 118/73, pulse 86, temperature 98.1 F (36.7 C), temperature source Oral, resp. rate 20, height 5\' 10"  (1.778 m), weight 111 kg, SpO2 94 %.   1. General well-developed male lying in bed in NAD,   2. Normal affect and insight, Not Suicidal or Homicidal, Awake Alert, Oriented X 3.  3. No F.N deficits, ALL C.Nerves Intact, minimal weakness in right lower extremity 4/5 .  4. Ears and Eyes appear Normal, Conjunctivae clear, PERRLA. Moist Oral Mucosa.  5. Supple Neck, No JVD, No cervical lymphadenopathy appriciated, No Carotid Bruits.  6. Symmetrical Chest wall movement, Good air movement bilaterally, CTAB.  7. RRR, No Gallops, Rubs or Murmurs, No Parasternal Heave.  8. Positive  Bowel Sounds, Abdomen Soft, No tenderness, No organomegaly appriciated,No rebound -guarding or rigidity.  9.  No Cyanosis, Normal Skin Turgor, No Skin Rash or Bruise.  10. Good muscle tone,  joints appear normal , no effusions, Normal ROM.  11. No Palpable Lymph Nodes in Neck or Axillae     Data Review:    CBC Recent Labs  Lab 10/01/19 1908  WBC 10.6*  HGB 16.7  HCT 50.2  PLT 167  MCV 99.8  MCH 33.2  MCHC 33.3  RDW 13.5  LYMPHSABS 3.1  MONOABS 0.8  EOSABS 0.2  BASOSABS 0.1   ------------------------------------------------------------------------------------------------------------------  Chemistries  Recent Labs  Lab 10/01/19 1908  NA 136  K 3.2*  CL 100  CO2 24  GLUCOSE 163*  BUN 17  CREATININE 0.87  CALCIUM 8.9  AST 17  ALT 26  ALKPHOS 51  BILITOT 0.6   ------------------------------------------------------------------------------------------------------------------ estimated creatinine clearance is 129.5 mL/min (by C-G formula based on SCr of 0.87 mg/dL). ------------------------------------------------------------------------------------------------------------------ No results for input(s): TSH, T4TOTAL, T3FREE, THYROIDAB in the last 72 hours.  Invalid input(s): FREET3  Coagulation profile Recent Labs  Lab 10/01/19 1908  INR 1.0   ------------------------------------------------------------------------------------------------------------------- No results for input(s): DDIMER in the last 72 hours. -------------------------------------------------------------------------------------------------------------------  Cardiac Enzymes No results for input(s): CKMB, TROPONINI, MYOGLOBIN in the last 168 hours.  Invalid input(s): CK ------------------------------------------------------------------------------------------------------------------ No results found for:  BNP   ---------------------------------------------------------------------------------------------------------------  Urinalysis No results found for: COLORURINE, APPEARANCEUR, LABSPEC, PHURINE, GLUCOSEU, HGBUR, BILIRUBINUR, KETONESUR, PROTEINUR, UROBILINOGEN, NITRITE, LEUKOCYTESUR  ----------------------------------------------------------------------------------------------------------------   Imaging Results:    CT HEAD CODE STROKE WO CONTRAST  Result Date: 10/01/2019 CLINICAL DATA:  Code stroke.  Right-sided numbness and tingling EXAM: CT HEAD WITHOUT CONTRAST TECHNIQUE: Contiguous axial images were obtained from the base of the skull through the vertex without intravenous contrast. COMPARISON:  2014 FINDINGS: Brain: No acute intracranial hemorrhage, mass effect, or edema. Gray-white differentiation is preserved. Ventricles and sulci are normal in size and configuration. There is no extra-axial fluid collection Vascular: No hyperdense vessel. There is intracranial atherosclerotic calcification at the skull base. Skull: Unremarkable Sinuses/Orbits: Aerated.  Orbits are unremarkable. Other: Mastoid air cells are clear ASPECTS [facility] Stroke Program Early CT Score) - Ganglionic level infarction (caudate, lentiform nuclei, internal capsule, insula, M1-M3 cortex): 7 - Supraganglionic infarction (M4-M6 cortex): 3 Total score (0-10 with 10 being normal): 10 IMPRESSION: No acute intracranial hemorrhage or evidence of acute infarction. ASPECT score  is 10. These results were called by telephone at the time of interpretation on 10/01/2019 at 7:25 pm to provider JOSEPH ZAMMIT , who verbally acknowledged these results. Electronically Signed   By: Guadlupe Spanish M.D.   On: 10/01/2019 19:25    My personal review of EKG: Rhythm NSR, Rate  83 /min, QTc 433 .   Assessment & Plan:    Active Problems:   Type 2 diabetes mellitus without complication (HCC)   Essential hypertension   Focal neurological  deficit   Right-sided weakness -Finding concerning for CVA versus TIA, and she had some weakness on presentation to ED, but appears to be improving, CT head with no acute finding, teleneurology consult appreciated, will proceed with MRI brain, MRA head and neck, echo with bubble study, will check A1c, lipid panel, will allow for permissive hypertension, will consult PT/OT/SLP. -Will start on full dose aspirin.  Hypertension -Will hold home medications and and allow for permissive hypertension.  Diabetes mellitus -Check A1c, will start on insulin sliding scale.  Hyperlipidemia -Patient reports his physician stopped his statin after he lost significant weight and his lipid panel as well as more controlled, will check lipid panel.  Tobacco abuse -He was counseled, he will be started on nicotine patch    DVT Prophylaxis  Lovenox - SCDs   AM Labs Ordered, also please review Full Orders  Family Communication: Admission, patients condition and plan of care including tests being ordered have been discussed with the patient and Wife  who indicate understanding and agree with the plan and Code Status.  Code Status Full  Likely DC to  Home  Condition GUARDED    Consults called: Tele neuro in ED   Admission status: Observation   Time spent in minutes : 55 minutes   Huey Bienenstock M.D on 10/01/2019 at 8:46 PM   [facility] - Office  [phone]

## 2019-10-02 ENCOUNTER — Encounter (HOSPITAL_COMMUNITY): Payer: Self-pay | Admitting: Internal Medicine

## 2019-10-02 ENCOUNTER — Observation Stay (HOSPITAL_BASED_OUTPATIENT_CLINIC_OR_DEPARTMENT_OTHER): Payer: BC Managed Care – PPO

## 2019-10-02 DIAGNOSIS — E119 Type 2 diabetes mellitus without complications: Secondary | ICD-10-CM | POA: Diagnosis not present

## 2019-10-02 DIAGNOSIS — G459 Transient cerebral ischemic attack, unspecified: Secondary | ICD-10-CM

## 2019-10-02 DIAGNOSIS — I10 Essential (primary) hypertension: Secondary | ICD-10-CM | POA: Diagnosis not present

## 2019-10-02 DIAGNOSIS — R29818 Other symptoms and signs involving the nervous system: Secondary | ICD-10-CM | POA: Diagnosis not present

## 2019-10-02 LAB — LIPID PANEL
Cholesterol: 150 mg/dL (ref 0–200)
HDL: 24 mg/dL — ABNORMAL LOW (ref >40)
LDL Cholesterol: 83 mg/dL (ref 0–99)
Total CHOL/HDL Ratio: 6.3 RATIO
Triglycerides: 216 mg/dL — ABNORMAL HIGH (ref <150)
VLDL: 43 mg/dL — ABNORMAL HIGH (ref 0–40)

## 2019-10-02 LAB — GLUCOSE, CAPILLARY
Glucose-Capillary: 160 mg/dL — ABNORMAL HIGH (ref 70–99)
Glucose-Capillary: 265 mg/dL — ABNORMAL HIGH (ref 70–99)

## 2019-10-02 LAB — HEMOGLOBIN A1C
Hgb A1c MFr Bld: 7.5 % — ABNORMAL HIGH (ref 4.8–5.6)
Mean Plasma Glucose: 168.55 mg/dL

## 2019-10-02 LAB — HIV ANTIBODY (ROUTINE TESTING W REFLEX): HIV Screen 4th Generation wRfx: NONREACTIVE

## 2019-10-02 MED ORDER — ACETAMINOPHEN 650 MG RE SUPP: 650.0000 mg | RECTAL | Status: DC | PRN

## 2019-10-02 MED ORDER — ATORVASTATIN CALCIUM 10 MG PO TABS
10.0000 mg | ORAL_TABLET | Freq: Every evening | ORAL | 1 refills | Status: DC
Start: 2019-10-02 — End: 2022-06-26

## 2019-10-02 MED ORDER — ACETAMINOPHEN 160 MG/5ML PO SOLN: 650.0000 mg | ORAL | Status: DC | PRN

## 2019-10-02 MED ORDER — INSULIN ASPART 100 UNIT/ML ~~LOC~~ SOLN
0.0000 [IU] | Freq: Three times a day (TID) | SUBCUTANEOUS | Status: DC
  Administered 2019-10-02: 8 [IU] via SUBCUTANEOUS

## 2019-10-02 MED ORDER — ENOXAPARIN SODIUM 40 MG/0.4ML ~~LOC~~ SOLN
40.0000 mg | SUBCUTANEOUS | Status: DC
  Administered 2019-10-02: 40 mg via SUBCUTANEOUS
  Filled 2019-10-02: qty 0.4

## 2019-10-02 MED ORDER — ASPIRIN EC 81 MG PO TBEC
81.0000 mg | DELAYED_RELEASE_TABLET | Freq: Every day | ORAL | Status: DC
Start: 2019-10-02 — End: 2022-06-26

## 2019-10-02 MED ORDER — STROKE: EARLY STAGES OF RECOVERY BOOK: Freq: Once | Status: DC

## 2019-10-02 MED ORDER — AMLODIPINE BESYLATE 5 MG PO TABS: ORAL_TABLET | ORAL | 1 refills | Status: DC

## 2019-10-02 MED ORDER — JARDIANCE 25 MG PO TABS: 25.0000 mg | ORAL_TABLET | Freq: Every day | ORAL | Status: DC

## 2019-10-02 MED ORDER — ACETAMINOPHEN 325 MG PO TABS: 650.0000 mg | ORAL_TABLET | ORAL | Status: DC | PRN

## 2019-10-02 NOTE — Discharge Summary (Signed)
Physician Discharge Summary  Shaun BlalockRichard C Williams ZOX:[id number] DOB: 1970/08/23 DOA: 10/01/2019  PCP: Lawerance SabalWorley, Miranda, PA  Admit date: 10/01/2019 Discharge date: 10/02/2019  Admitted From:  Home  Disposition:  Home   Recommendations for Outpatient Follow-up:  1. Follow up with PCP in 1 weeks 2. Follow up with neurologist in 1 month  Discharge Condition: STABLE   CODE STATUS: FULL    Brief Hospitalization Summary: Please see all hospital notes, images, labs for full details of the hospitalization. Shaun Williams  is a 49 y.o. male, with past medical history of diabetes mellitus, hypertension, hyperlipidemia, obesity (lost significant weight and hyperlipidemia has improved when he stopped his statin), tobacco abuse, patient presents to [facility] secondary to new onset right facial/arm/leg numbness, as well he does report some right sided weakness as well, reports started at 2 PM, but then persisted through the day which prompted him to come to [facility], code stroke was called, patient was noted to have right lower extremity drift, therefore TPA given he passed 4.5 hours of his symptoms, patient denies chest pain, shortness of breath, fever, chills. [facility] course -In [facility] CT head was with no acute finding, teleneurology assessment NIHSS is 2, 1 pt for RLE drift, 1 pt for R sided numbness, recommendation for admission for acute CVA work-up, transhospitalist consulted to admit.  The patient was admitted for TIA work-up and he had an MRI MRA of the brain with no findings of large vessel occlusion or acute ischemic event.  He is feeling much better.  He has been seen by PT OT and he has had a 2D echocardiogram with normal findings.  He is stable to discharge home.  He will discharge on aspirin 81 mg daily.  We have also added atorvastatin 10 mg every evening.  We have strongly advised smoking cessation.  Close follow-up with PCP recommended.   Discharge Diagnoses:  Active Problems:   Type 2 diabetes mellitus without  complication [facility](HCC)   Essential hypertension   Focal neurological deficit  Discharge Instructions:  Allergies as of 10/02/2019   No Known Allergies     Medication List    TAKE these medications   amLODipine 5 MG tablet Commonly known as: NORVASC TAKE ONE TABLET BY MOUTH EVERY DAY What changed: medication strength   aspirin EC 81 MG tablet Take 1 tablet (81 mg total) by mouth daily.   atorvastatin 10 MG tablet Commonly known as: Lipitor Take 1 tablet (10 mg total) by mouth every evening.   Jardiance 25 MG Tabs tablet Generic drug: empagliflozin Take 1 tablet (25 mg total) by mouth daily. Start taking on: October 07, 2019 What changed: These instructions start on October 07, 2019. If you are unsure what to do until then, ask your doctor or other care provider.   lisinopril-hydrochlorothiazide 20-25 MG tablet Commonly known as: ZESTORETIC TAKE ONE TABLET BY MOUTH ONCE EVERY DAY What changed:   how much to take  how to take this  when to take this      Follow-up Information    Lawerance SabalWorley, Miranda, [place]. Schedule an appointment as soon as possible for a visit in 1 week(s).   Specialty: Family Medicine Contact information: [address] [place] [id number] [phone]        Beryle Beamsoonquah, Kofi, MD. Schedule an appointment as soon as possible for a visit in 1 month(s).   Specialty: Neurology Why: TIA Follow Up  Contact information: [address] [place] [id number] [phone]  No Known Allergies Allergies as of 10/02/2019   No Known Allergies     Medication List    TAKE these medications   amLODipine 5 MG tablet Commonly known as: NORVASC TAKE ONE TABLET BY MOUTH EVERY DAY What changed: medication strength   aspirin EC 81 MG tablet Take 1 tablet (81 mg total) by mouth daily.   atorvastatin 10 MG tablet Commonly known as: Lipitor Take 1 tablet (10 mg total) by mouth every evening.   Jardiance 25 MG Tabs tablet Generic drug: empagliflozin Take  1 tablet (25 mg total) by mouth daily. Start taking on: October 07, 2019 What changed: These instructions start on October 07, 2019. If you are unsure what to do until then, ask your doctor or other care provider.   lisinopril-hydrochlorothiazide 20-25 MG tablet Commonly known as: ZESTORETIC TAKE ONE TABLET BY MOUTH ONCE EVERY DAY What changed:   how much to take  how to take this  when to take this       Procedures/Studies: MR ANGIO HEAD WO CONTRAST  Result Date: 10/01/2019 CLINICAL DATA:  Right-sided weakness EXAM: MRI HEAD WITHOUT CONTRAST MRA HEAD WITHOUT CONTRAST MRA NECK WITHOUT AND WITH CONTRAST TECHNIQUE: Multiplanar, multiecho pulse sequences of the brain and surrounding structures were obtained without intravenous contrast. Angiographic images of the Circle of Willis were obtained using MRA technique without intravenous contrast. Angiographic images of the neck were obtained using MRA technique without and with intravenous contrast. Carotid stenosis measurements (when applicable) are obtained utilizing NASCET criteria, using the distal internal carotid diameter as the denominator. CONTRAST:  84mL GADAVIST GADOBUTROL 1 MMOL/ML IV SOLN COMPARISON:  None. FINDINGS: MRI HEAD Brain: There is no acute infarction or intracranial hemorrhage. There is no intracranial mass, mass effect, or edema. There is no hydrocephalus or extra-axial fluid collection. Ventricles and sulci are within normal limits in size and configuration. Minimal foci of T2 hyperintensity in the supratentorial white matter likely reflect nonspecific gliosis/demyelination of doubtful clinical significance. Vascular: Major vessel flow voids at the skull base are preserved. Skull and upper cervical spine: Normal marrow signal is preserved. Sinuses/Orbits: Paranasal sinuses are aerated. Orbits are unremarkable. Other: Sella is unremarkable.  Mastoid air cells are clear. MRA HEAD Intracranial internal carotid arteries are patent. Middle  and anterior cerebral arteries are patent. Intracranial vertebral arteries, basilar artery, posterior cerebral arteries are patent. There is no significant stenosis or aneurysm. MRA NECK Common, internal, and external carotid arteries are patent. Extracranial vertebral arteries are patent and codominant. No hemodynamically significant stenosis. IMPRESSION: No evidence of acute infarction, hemorrhage, or mass. No large vessel occlusion or hemodynamically significant stenosis. Electronically Signed   By: Guadlupe Spanish M.D.   On: 10/01/2019 22:02   MR ANGIO NECK W WO CONTRAST  Result Date: 10/01/2019 CLINICAL DATA:  Right-sided weakness EXAM: MRI HEAD WITHOUT CONTRAST MRA HEAD WITHOUT CONTRAST MRA NECK WITHOUT AND WITH CONTRAST TECHNIQUE: Multiplanar, multiecho pulse sequences of the brain and surrounding structures were obtained without intravenous contrast. Angiographic images of the Circle of Willis were obtained using MRA technique without intravenous contrast. Angiographic images of the neck were obtained using MRA technique without and with intravenous contrast. Carotid stenosis measurements (when applicable) are obtained utilizing NASCET criteria, using the distal internal carotid diameter as the denominator. CONTRAST:  62mL GADAVIST GADOBUTROL 1 MMOL/ML IV SOLN COMPARISON:  None. FINDINGS: MRI HEAD Brain: There is no acute infarction or intracranial hemorrhage. There is no intracranial mass, mass effect, or edema. There is no hydrocephalus or extra-axial fluid  collection. Ventricles and sulci are within normal limits in size and configuration. Minimal foci of T2 hyperintensity in the supratentorial white matter likely reflect nonspecific gliosis/demyelination of doubtful clinical significance. Vascular: Major vessel flow voids at the skull base are preserved. Skull and upper cervical spine: Normal marrow signal is preserved. Sinuses/Orbits: Paranasal sinuses are aerated. Orbits are unremarkable. Other:  Sella is unremarkable.  Mastoid air cells are clear. MRA HEAD Intracranial internal carotid arteries are patent. Middle and anterior cerebral arteries are patent. Intracranial vertebral arteries, basilar artery, posterior cerebral arteries are patent. There is no significant stenosis or aneurysm. MRA NECK Common, internal, and external carotid arteries are patent. Extracranial vertebral arteries are patent and codominant. No hemodynamically significant stenosis. IMPRESSION: No evidence of acute infarction, hemorrhage, or mass. No large vessel occlusion or hemodynamically significant stenosis. Electronically Signed   By: Macy Mis M.D.   On: 10/01/2019 22:02   MR BRAIN WO CONTRAST  Result Date: 10/01/2019 CLINICAL DATA:  Right-sided weakness EXAM: MRI HEAD WITHOUT CONTRAST MRA HEAD WITHOUT CONTRAST MRA NECK WITHOUT AND WITH CONTRAST TECHNIQUE: Multiplanar, multiecho pulse sequences of the brain and surrounding structures were obtained without intravenous contrast. Angiographic images of the Circle of Willis were obtained using MRA technique without intravenous contrast. Angiographic images of the neck were obtained using MRA technique without and with intravenous contrast. Carotid stenosis measurements (when applicable) are obtained utilizing NASCET criteria, using the distal internal carotid diameter as the denominator. CONTRAST:  30mL GADAVIST GADOBUTROL 1 MMOL/ML IV SOLN COMPARISON:  None. FINDINGS: MRI HEAD Brain: There is no acute infarction or intracranial hemorrhage. There is no intracranial mass, mass effect, or edema. There is no hydrocephalus or extra-axial fluid collection. Ventricles and sulci are within normal limits in size and configuration. Minimal foci of T2 hyperintensity in the supratentorial white matter likely reflect nonspecific gliosis/demyelination of doubtful clinical significance. Vascular: Major vessel flow voids at the skull base are preserved. Skull and upper cervical spine:  Normal marrow signal is preserved. Sinuses/Orbits: Paranasal sinuses are aerated. Orbits are unremarkable. Other: Sella is unremarkable.  Mastoid air cells are clear. MRA HEAD Intracranial internal carotid arteries are patent. Middle and anterior cerebral arteries are patent. Intracranial vertebral arteries, basilar artery, posterior cerebral arteries are patent. There is no significant stenosis or aneurysm. MRA NECK Common, internal, and external carotid arteries are patent. Extracranial vertebral arteries are patent and codominant. No hemodynamically significant stenosis. IMPRESSION: No evidence of acute infarction, hemorrhage, or mass. No large vessel occlusion or hemodynamically significant stenosis. Electronically Signed   By: Macy Mis M.D.   On: 10/01/2019 22:02   ECHOCARDIOGRAM COMPLETE BUBBLE STUDY  Result Date: 10/02/2019    ECHOCARDIOGRAM REPORT   Patient Name:   Shaun Williams Date of Exam: 10/02/2019 Medical Rec #:  [id number]          Height:       70.0 in Accession #:    [id number]         Weight:       244.7 lb Date of Birth:  May 25, 1970          BSA:          2.274 m Patient Age:    78 years           BP:           140/91 mmHg Patient Gender: M                  HR:  73 bpm. Exam Location:  Inpatient Procedure: 2D Echo Indications:    TIA  History:        Patient has no prior history of Echocardiogram examinations.  Sonographer:    Celene Skeen RDCS (AE) Referring Phys: [id number] IMPRESSIONS  1. Left ventricular ejection fraction, by estimation, is 60 to 65%. The left ventricle has normal function. The left ventricle has no regional wall motion abnormalities. Left ventricular diastolic parameters were normal.  2. Right ventricular systolic function is normal. The right ventricular size is normal.  3. The mitral valve is grossly normal. No evidence of mitral valve regurgitation.  4. The aortic valve is tricuspid. Aortic valve regurgitation is not visualized. No  aortic stenosis is present.  5. The inferior vena cava is dilated in size with <50% respiratory variability, suggesting right atrial pressure of 15 mmHg. FINDINGS  Left Ventricle: Left ventricular ejection fraction, by estimation, is 60 to 65%. The left ventricle has normal function. The left ventricle has no regional wall motion abnormalities. The left ventricular internal cavity size was normal in size. There is  no left ventricular hypertrophy. Left ventricular diastolic parameters were normal. Right Ventricle: The right ventricular size is normal. No increase in right ventricular wall thickness. Right ventricular systolic function is normal. Left Atrium: Left atrial size was normal in size. Right Atrium: Right atrial size was normal in size. Pericardium: There is no evidence of pericardial effusion. Mitral Valve: The mitral valve is grossly normal. No evidence of mitral valve regurgitation. Tricuspid Valve: The tricuspid valve is grossly normal. Tricuspid valve regurgitation is not demonstrated. Aortic Valve: The aortic valve is tricuspid. Aortic valve regurgitation is not visualized. No aortic stenosis is present. Pulmonic Valve: The pulmonic valve was grossly normal. Pulmonic valve regurgitation is not visualized. Aorta: The aortic root is normal in size and structure. Venous: The inferior vena cava is dilated in size with less than 50% respiratory variability, suggesting right atrial pressure of 15 mmHg. IAS/Shunts: No atrial level shunt detected by color flow Doppler.  LEFT VENTRICLE PLAX 2D LVIDd:         5.80 cm  Diastology LVIDs:         3.82 cm  LV e' lateral:   11.60 cm/s LV PW:         0.97 cm  LV E/e' lateral: 6.6 LV IVS:        0.91 cm  LV e' medial:    7.62 cm/s LVOT diam:     2.20 cm  LV E/e' medial:  10.1 LV SV:         82 LV SV Index:   36 LVOT Area:     3.80 cm  LEFT ATRIUM           Index LA diam:      4.00 cm 1.76 cm/m LA Vol (A2C): 45.1 ml 19.83 ml/m  AORTIC VALVE LVOT Vmax:   117.00 cm/s  LVOT Vmean:  72.100 cm/s LVOT VTI:    0.215 m  AORTA Ao Root diam: 3.50 cm MITRAL VALVE MV Area (PHT): 3.81 cm    SHUNTS MV Decel Time: 199 msec    Systemic VTI:  0.22 m MV E velocity: 77.10 cm/s  Systemic Diam: 2.20 cm MV A velocity: 67.30 cm/s MV E/A ratio:  1.15 Prentice Docker MD Electronically signed by Prentice Docker MD Signature Date/Time: 10/02/2019/2:47:42 PM    Final    CT HEAD CODE STROKE WO CONTRAST  Result Date: 10/01/2019 CLINICAL DATA:  Code stroke.  Right-sided numbness and tingling EXAM: CT HEAD WITHOUT CONTRAST TECHNIQUE: Contiguous axial images were obtained from the base of the skull through the vertex without intravenous contrast. COMPARISON:  2014 FINDINGS: Brain: No acute intracranial hemorrhage, mass effect, or edema. Gray-white differentiation is preserved. Ventricles and sulci are normal in size and configuration. There is no extra-axial fluid collection Vascular: No hyperdense vessel. There is intracranial atherosclerotic calcification at the skull base. Skull: Unremarkable Sinuses/Orbits: Aerated.  Orbits are unremarkable. Other: Mastoid air cells are clear ASPECTS [place] Stroke Program Early CT Score) - Ganglionic level infarction (caudate, lentiform nuclei, internal capsule, insula, M1-M3 cortex): 7 - Supraganglionic infarction (M4-M6 cortex): 3 Total score (0-10 with 10 being normal): 10 IMPRESSION: No acute intracranial hemorrhage or evidence of acute infarction. ASPECT score is 10. These results were called by telephone at the time of interpretation on 10/01/2019 at 7:25 pm to provider [facility] ZAMMIT , who verbally acknowledged these results. Electronically Signed   By: Guadlupe Spanish M.D.   On: 10/01/2019 19:25      Subjective: Pt reports feeling much better.  No recurrence of symptoms.  No CP.  NO SOB.    Discharge Exam: Vitals:   10/02/19 1100 10/02/19 1300  BP: (!) 143/92 (!) 140/91  Pulse:  73  Resp:    Temp: 98.1 F (36.7 C)   SpO2: 95% 97%   Vitals:    10/02/19 0500 10/02/19 0703 10/02/19 1100 10/02/19 1300  BP: 137/85 (!) 142/93 (!) 143/92 (!) 140/91  Pulse: 66 69  73  Resp: 18 18    Temp: 98 F (36.7 C) 98.2 F (36.8 C) 98.1 F (36.7 C)   TempSrc: Oral Oral Oral   SpO2: 97% 98% 95% 97%  Weight:      Height:       General: Pt is alert, awake, not in acute distress Cardiovascular: RRR, S1/S2 +, no rubs, no gallops Respiratory: CTA bilaterally, no wheezing, no rhonchi Abdominal: Soft, NT, ND, bowel sounds + Extremities: no edema, no cyanosis Neurological exam: nonfocal exam.    The results of significant diagnostics from this hospitalization (including imaging, microbiology, ancillary and laboratory) are listed below for reference.     Microbiology: Recent Results (from the past 240 hour(s))  SARS Coronavirus 2 by RT PCR (hospital order, performed in [facility] hospital lab) Nasopharyngeal Nasopharyngeal Swab     Status: None   Collection Time: 10/01/19  7:47 PM   Specimen: Nasopharyngeal Swab  Result Value Ref Range Status   SARS Coronavirus 2 NEGATIVE NEGATIVE Final    Comment: (NOTE) SARS-CoV-2 target nucleic acids are NOT DETECTED. The SARS-CoV-2 RNA is generally detectable in upper and lower respiratory specimens during the acute phase of infection. The lowest concentration of SARS-CoV-2 viral copies this assay can detect is 250 copies / mL. A negative result does not preclude SARS-CoV-2 infection and should not be used as the sole basis for treatment or other patient management decisions.  A negative result may occur with improper specimen collection / handling, submission of specimen other than nasopharyngeal swab, presence of viral mutation(s) within the areas targeted by this assay, and inadequate number of viral copies (<250 copies / mL). A negative result must be combined with clinical observations, patient history, and epidemiological information. Fact Sheet for Patients:    BoilerBrush.com.cy Fact Sheet for Healthcare Providers: https://pope.com/ This test is not yet approved or cleared  by the [place] FDA and has been authorized for detection and/or diagnosis of SARS-CoV-2 by  FDA under an Emergency Use Authorization (EUA).  This EUA will remain in effect (meaning this test can be used) for the duration of the COVID-19 declaration under Section 564(b)(1) of the Act, 21 U.S.C. section 360bbb-3(b)(1), unless the authorization is terminated or revoked sooner. Performed at [facility], [address] [place], [place] [address]      Labs: BNP (last 3 results) No results for input(s): BNP in the last 8760 hours. Basic Metabolic Panel: Recent Labs  Lab 10/01/19 1908  NA 136  K 3.2*  CL 100  CO2 24  GLUCOSE 163*  BUN 17  CREATININE 0.87  CALCIUM 8.9   Liver Function Tests: Recent Labs  Lab 10/01/19 1908  AST 17  ALT 26  ALKPHOS 51  BILITOT 0.6  PROT 7.3  ALBUMIN 4.1   No results for input(s): LIPASE, AMYLASE in the last 168 hours. No results for input(s): AMMONIA in the last 168 hours. CBC: Recent Labs  Lab 10/01/19 1908  WBC 10.6*  NEUTROABS 6.4  HGB 16.7  HCT 50.2  MCV 99.8  PLT 167   Cardiac Enzymes: No results for input(s): CKTOTAL, CKMB, CKMBINDEX, TROPONINI in the last 168 hours. BNP: Invalid input(s): POCBNP CBG: Recent Labs  Lab 10/01/19 1905 10/02/19 0730 10/02/19 1108  GLUCAP 148* 160* 265*   D-Dimer No results for input(s): DDIMER in the last 72 hours. Hgb A1c Recent Labs    10/01/19 1913  HGBA1C 7.5*   Lipid Profile Recent Labs    10/02/19 0539  CHOL 150  HDL 24*  LDLCALC 83  TRIG 208*  CHOLHDL 6.3   Thyroid function studies No results for input(s): TSH, T4TOTAL, T3FREE, THYROIDAB in the last 72 hours.  Invalid input(s): FREET3 Anemia work up No results for input(s): VITAMINB12, FOLATE, FERRITIN, TIBC, IRON, RETICCTPCT in the last 72  hours. Urinalysis No results found for: COLORURINE, APPEARANCEUR, LABSPEC, PHURINE, GLUCOSEU, HGBUR, BILIRUBINUR, KETONESUR, PROTEINUR, UROBILINOGEN, NITRITE, LEUKOCYTESUR Sepsis Labs Invalid input(s): PROCALCITONIN,  WBC,  LACTICIDVEN Microbiology Recent Results (from the past 240 hour(s))  SARS Coronavirus 2 by RT PCR (hospital order, performed in [facility] hospital lab) Nasopharyngeal Nasopharyngeal Swab     Status: None   Collection Time: 10/01/19  7:47 PM   Specimen: Nasopharyngeal Swab  Result Value Ref Range Status   SARS Coronavirus 2 NEGATIVE NEGATIVE Final    Comment: (NOTE) SARS-CoV-2 target nucleic acids are NOT DETECTED. The SARS-CoV-2 RNA is generally detectable in upper and lower respiratory specimens during the acute phase of infection. The lowest concentration of SARS-CoV-2 viral copies this assay can detect is 250 copies / mL. A negative result does not preclude SARS-CoV-2 infection and should not be used as the sole basis for treatment or other patient management decisions.  A negative result may occur with improper specimen collection / handling, submission of specimen other than nasopharyngeal swab, presence of viral mutation(s) within the areas targeted by this assay, and inadequate number of viral copies (<250 copies / mL). A negative result must be combined with clinical observations, patient history, and epidemiological information. Fact Sheet for Patients:   BoilerBrush.com.cy Fact Sheet for Healthcare Providers: https://pope.com/ This test is not yet approved or cleared  by the [place] FDA and has been authorized for detection and/or diagnosis of SARS-CoV-2 by FDA under an Emergency Use Authorization (EUA).  This EUA will remain in effect (meaning this test can be used) for the duration of the COVID-19 declaration under Section 564(b)(1) of the Act, 21 U.S.C. section 360bbb-3(b)(1), unless  the  authorization is terminated or revoked sooner. Performed at [facility], [address], [place], [place] [address]    Time coordinating discharge:   SIGNED:  Standley Dakins, MD  [facility] 10/02/2019, 3:03 PM How to contact the [facility] Attending or Consulting provider 7A - 7P or covering provider during after hours 7P -7A, for this patient?  1. Check the care team in [facility] and look for a) attending/consulting [facility] provider listed and b) the [facility] team listed 2. Log into [email or web address] and use [facility]'s universal password to access. If you do not have the password, please contact the hospital operator. 3. Locate the [facility] provider you are looking for under [facility] and page to a number that you can be directly reached. 4. If you still have difficulty reaching the provider, please page the [facility] (Director on Call) for the Hospitalists listed on amion for assistance.

## 2019-10-02 NOTE — Plan of Care (Signed)
Patient has not had any stroke like symptoms this shift. His vitals remain stable and he remains alert and oriented x 4. NIH remains at 0.

## 2019-10-02 NOTE — Evaluation (Signed)
Physical Therapy Evaluation Patient Details Name: Shaun Williams MRN: [id number] DOB: 05-May-1971 Today's Date: 10/02/2019   History of Present Illness  Shaun Williams  is a 49 y.o. male, with past medical history of diabetes mellitus, hypertension, hyperlipidemia, obesity (lost significant weight and hyperlipidemia has improved when he stopped his statin), tobacco abuse, patient presents to ED secondary to new onset right facial/arm/leg numbness, as well he does report some right sided weakness as well, reports started at 2 PM, but then persisted through the day which prompted him to come to ED, code stroke was called, patient was noted to have right lower extremity drift, therefore TPA given he passed 4.5 hours of his symptoms, patient denies chest pain, shortness of breath, fever, chills.    Clinical Impression  Patient functioning near baseline for mobility. He is able to complete bed mobility and transfer independently and ambulate without AD. Patient has c/o minimal R hand numbness but LE symptoms have resolved. Patient appears to have intact sensation and good LE strength equal bilateral. Patient returned to bed at end of session. Patient discharged to care of nursing for ambulation daily as tolerated for length of stay.     Follow Up Recommendations No PT follow up    Equipment Recommendations  None recommended by PT    Recommendations for Other Services       Precautions / Restrictions Precautions Precautions: None Restrictions Weight Bearing Restrictions: No      Mobility  Bed Mobility Overal bed mobility: Independent             General bed mobility comments: transfer to stting EOB with HOB elevated  Transfers Overall transfer level: Independent Equipment used: None             General transfer comment: transfer to standing without AD  Ambulation/Gait Ambulation/Gait assistance: Supervision Gait Distance (Feet): 120 Feet Assistive device:  None Gait Pattern/deviations: Decreased step length - right;Decreased step length - left;Decreased stride length Gait velocity: minimally decreased   General Gait Details: minimally decreased cadence, no AD, without loss of balance  Stairs            Wheelchair Mobility    Modified Rankin (Stroke Patients Only)       Balance Overall balance assessment: No apparent balance deficits (not formally assessed)                                           Pertinent Vitals/Pain Pain Assessment: No/denies pain    Home Living Family/patient expects to be discharged to:: Private residence Living Arrangements: Spouse/significant other Available Help at Discharge: Family Type of Home: House Home Access: Stairs to enter Entrance Stairs-Rails: None Entrance Stairs-Number of Steps: 1 Home Layout: Two level;Able to live on main level with bedroom/bathroom Home Equipment: None      Prior Function Level of Independence: Independent         Comments: Patient states independent wihtou AD, community ambulator, works, Dispensing optician        Extremity/Trunk Assessment   Upper Extremity Assessment Upper Extremity Assessment: Overall WFL for tasks assessed    Lower Extremity Assessment Lower Extremity Assessment: Overall WFL for tasks assessed       Communication   Communication: No difficulties  Cognition Arousal/Alertness: Awake/alert Behavior During Therapy: WFL for tasks assessed/performed Overall Cognitive Status: Within Functional Limits for tasks assessed  General Comments      Exercises     Assessment/Plan    PT Assessment Patent does not need any further PT services  PT Problem List         PT Treatment Interventions      PT Goals (Current goals can be found in the Care Plan section)  Acute Rehab PT Goals Patient Stated Goal: return home PT Goal Formulation: With  patient Time For Goal Achievement: 10/02/19 Potential to Achieve Goals: Good    Frequency     Barriers to discharge        Co-evaluation               AM-PAC PT "6 Clicks" Mobility  Outcome Measure Help needed turning from your back to your side while in a flat bed without using bedrails?: None Help needed moving from lying on your back to sitting on the side of a flat bed without using bedrails?: None Help needed moving to and from a bed to a chair (including a wheelchair)?: None Help needed standing up from a chair using your arms (e.g., wheelchair or bedside chair)?: None Help needed to walk in hospital room?: None Help needed climbing 3-5 steps with a railing? : None 6 Click Score: 24    End of Session   Activity Tolerance: Patient tolerated treatment well Patient left: in bed;with family/visitor present;with call bell/phone within reach Nurse Communication: Mobility status PT Visit Diagnosis: Unsteadiness on feet (R26.81);Other abnormalities of gait and mobility (R26.89);Muscle weakness (generalized) (M62.81)    Time: 1950-9326 PT Time Calculation (min) (ACUTE ONLY): 7 min   Charges:   PT Evaluation $PT Eval Moderate Complexity: 1 Mod         9:59 AM, 10/02/19 Wyman Songster PT, DPT Physical Therapist at [facility]

## 2019-10-02 NOTE — Discharge Instructions (Signed)
IMPORTANT INFORMATION: PAY CLOSE ATTENTION   PHYSICIAN DISCHARGE INSTRUCTIONS  Follow with Primary care provider  Worley, Miranda, PA  and other consultants as instructed by your Hospitalist Physician  SEEK MEDICAL CARE OR RETURN TO EMERGENCY ROOM IF SYMPTOMS COME BACK, WORSEN OR NEW PROBLEM DEVELOPS   Please note: You were cared for by a hospitalist during your hospital stay. Every effort will be made to forward records to your primary care provider.  You can request that your primary care provider send for your hospital records if they have not received them.  Once you are discharged, your primary care physician will handle any further medical issues. Please note that NO REFILLS for any discharge medications will be authorized once you are discharged, as it is imperative that you return to your primary care physician (or establish a relationship with a primary care physician if you do not have one) for your post hospital discharge needs so that they can reassess your need for medications and monitor your lab values.  Please get a complete blood count and chemistry panel checked by your Primary MD at your next visit, and again as instructed by your Primary MD.  Get Medicines reviewed and adjusted: Please take all your medications with you for your next visit with your Primary MD  Laboratory/radiological data: Please request your Primary MD to go over all hospital tests and procedure/radiological results at the follow up, please ask your primary care provider to get all Hospital records sent to his/her office.  In some cases, they will be blood work, cultures and biopsy results pending at the time of your discharge. Please request that your primary care provider follow up on these results.  If you are diabetic, please bring your blood sugar readings with you to your follow up appointment with primary care.    Please call and make your follow up appointments as soon as possible.    Also Note  the following: If you experience worsening of your admission symptoms, develop shortness of breath, life threatening emergency, suicidal or homicidal thoughts you must seek medical attention immediately by calling 911 or calling your MD immediately  if symptoms less severe.  You must read complete instructions/literature along with all the possible adverse reactions/side effects for all the Medicines you take and that have been prescribed to you. Take any new Medicines after you have completely understood and accpet all the possible adverse reactions/side effects.   Do not drive when taking Pain medications or sleeping medications (Benzodiazepines)  Do not take more than prescribed Pain, Sleep and Anxiety Medications. It is not advisable to combine anxiety,sleep and pain medications without talking with your primary care practitioner  Special Instructions: If you have smoked or chewed Tobacco  in the last 2 yrs please stop smoking, stop any regular Alcohol  and or any Recreational drug use.  Wear Seat belts while driving.  Do not drive if taking any narcotic, mind altering or controlled substances or recreational drugs or alcohol.       

## 2019-10-02 NOTE — Progress Notes (Signed)
OT Cancellation Note  Patient Details Name: Shaun Williams MRN: [id number] DOB: 1971/03/19   Cancelled Treatment:    Reason Eval/Treat Not Completed: OT screened, no needs identified, will sign off. Pt performing ADLs and functional mobility independently. BUE strength is WNL, coordination and sensation are intact. Pt reporting mild tingling in right thumb, index, and middle fingers. No further OT services required at this time.    Ezra Sites, OTR/L  [phone] 10/02/2019, 7:49 AM

## 2019-10-02 NOTE — Progress Notes (Signed)
SLP Cancellation Note  Patient Details Name: Shaun Williams MRN: [id number] DOB: [birth year]   Cancelled treatment:       Reason Eval/Treat Not Completed: SLP screened, no needs identified, will sign off. With the exception of mild tingling in R hand, all symptoms have resolved. Speech and language are functioning at baseline. There are no further ST needs noted at this time. Thank you for this referral.   Genevieve Ritzel H. Romie Levee, CCC-SLP Speech Language Pathologist  Georgetta Haber 10/02/2019, 9:55 AM

## 2019-10-02 NOTE — Plan of Care (Signed)
Patient discharged.  Problem: Education: Goal: Knowledge of General Education information will improve Description: Including pain rating scale, medication(s)/side effects and non-pharmacologic comfort measures 10/02/2019 1541 by Hamilton, Sheena D, RN Outcome: Completed/Met 10/02/2019 1540 by Hamilton, Sheena D, RN Outcome: Progressing   

## 2019-10-02 NOTE — Progress Notes (Signed)
  Echocardiogram 2D Echocardiogram has been performed.  Shaun Williams 10/02/2019, 1:45 PM

## 2019-10-09 DIAGNOSIS — Z6833 Body mass index (BMI) 33.0-33.9, adult: Secondary | ICD-10-CM | POA: Diagnosis not present

## 2019-10-09 DIAGNOSIS — E1165 Type 2 diabetes mellitus with hyperglycemia: Secondary | ICD-10-CM | POA: Diagnosis not present

## 2019-10-09 DIAGNOSIS — I10 Essential (primary) hypertension: Secondary | ICD-10-CM | POA: Diagnosis not present

## 2019-10-09 DIAGNOSIS — G459 Transient cerebral ischemic attack, unspecified: Secondary | ICD-10-CM | POA: Diagnosis not present

## 2021-02-27 ENCOUNTER — Other Ambulatory Visit: Payer: Self-pay

## 2021-02-27 ENCOUNTER — Emergency Department (HOSPITAL_COMMUNITY): Payer: BC Managed Care – PPO

## 2021-02-27 ENCOUNTER — Emergency Department (HOSPITAL_COMMUNITY)
Admission: EM | Admit: 2021-02-27 | Discharge: 2021-02-27 | Disposition: A | Discharge status: Home / Self Care | Payer: BC Managed Care – PPO | Attending: Emergency Medicine | Admitting: Emergency Medicine

## 2021-02-27 ENCOUNTER — Encounter (HOSPITAL_COMMUNITY): Payer: Self-pay | Admitting: *Deleted

## 2021-02-27 DIAGNOSIS — R1030 Lower abdominal pain, unspecified: Secondary | ICD-10-CM

## 2021-02-27 DIAGNOSIS — Z7984 Long term (current) use of oral hypoglycemic drugs: Secondary | ICD-10-CM | POA: Insufficient documentation

## 2021-02-27 DIAGNOSIS — R112 Nausea with vomiting, unspecified: Secondary | ICD-10-CM | POA: Diagnosis not present

## 2021-02-27 DIAGNOSIS — R1011 Right upper quadrant pain: Secondary | ICD-10-CM

## 2021-02-27 DIAGNOSIS — F17210 Nicotine dependence, cigarettes, uncomplicated: Secondary | ICD-10-CM | POA: Insufficient documentation

## 2021-02-27 DIAGNOSIS — Z79899 Other long term (current) drug therapy: Secondary | ICD-10-CM | POA: Insufficient documentation

## 2021-02-27 DIAGNOSIS — I10 Essential (primary) hypertension: Secondary | ICD-10-CM | POA: Insufficient documentation

## 2021-02-27 DIAGNOSIS — E119 Type 2 diabetes mellitus without complications: Secondary | ICD-10-CM | POA: Diagnosis not present

## 2021-02-27 DIAGNOSIS — R1031 Right lower quadrant pain: Secondary | ICD-10-CM | POA: Insufficient documentation

## 2021-02-27 DIAGNOSIS — D72829 Elevated white blood cell count, unspecified: Secondary | ICD-10-CM | POA: Insufficient documentation

## 2021-02-27 DIAGNOSIS — Z7982 Long term (current) use of aspirin: Secondary | ICD-10-CM | POA: Insufficient documentation

## 2021-02-27 LAB — COMPREHENSIVE METABOLIC PANEL
ALT: 48 U/L — ABNORMAL HIGH (ref 0–44)
AST: 17 U/L (ref 15–41)
Albumin: 4.5 g/dL (ref 3.5–5.0)
Alkaline Phosphatase: 63 U/L (ref 38–126)
Anion gap: 11 (ref 5–15)
BUN: 29 mg/dL — ABNORMAL HIGH (ref 6–20)
CO2: 26 mmol/L (ref 22–32)
Calcium: 9.6 mg/dL (ref 8.9–10.3)
Chloride: 94 mmol/L — ABNORMAL LOW (ref 98–111)
Creatinine, Ser: 0.86 mg/dL (ref 0.61–1.24)
GFR, Estimated: >60 mL/min (ref >60)
Glucose, Bld: 344 mg/dL — ABNORMAL HIGH (ref 70–99)
Potassium: 3.7 mmol/L (ref 3.5–5.1)
Sodium: 131 mmol/L — ABNORMAL LOW (ref 135–145)
Total Bilirubin: 0.6 mg/dL (ref 0.3–1.2)
Total Protein: 8 g/dL (ref 6.5–8.1)

## 2021-02-27 LAB — BLOOD GAS, VENOUS
Acid-Base Excess: 2.9 mmol/L — ABNORMAL HIGH (ref 0.0–2.0)
Bicarbonate: 26 mmol/L (ref 20.0–28.0)
FIO2: 21
O2 Saturation: 72 %
Patient temperature: 36.6
pCO2, Ven: 44.8 mmHg (ref 44.0–60.0)
pH, Ven: 7.4 (ref 7.250–7.430)
pO2, Ven: 38.2 mmHg (ref 32.0–45.0)

## 2021-02-27 LAB — URINALYSIS, ROUTINE W REFLEX MICROSCOPIC
Bacteria, UA: NONE SEEN
Bilirubin Urine: NEGATIVE
Glucose, UA: >=500 mg/dL — AB
Hgb urine dipstick: NEGATIVE
Ketones, ur: NEGATIVE mg/dL
Leukocytes,Ua: NEGATIVE
Nitrite: NEGATIVE
Protein, ur: 30 mg/dL — AB
Specific Gravity, Urine: 1.021 (ref 1.005–1.030)
pH: 6 (ref 5.0–8.0)

## 2021-02-27 LAB — CBC
HCT: 44.1 % (ref 39.0–52.0)
Hemoglobin: 15.3 g/dL (ref 13.0–17.0)
MCH: 31.9 pg (ref 26.0–34.0)
MCHC: 34.7 g/dL (ref 30.0–36.0)
MCV: 92.1 fL (ref 80.0–100.0)
Platelets: 194 10*3/uL (ref 150–400)
RBC: 4.79 MIL/uL (ref 4.22–5.81)
RDW: 12.2 % (ref 11.5–15.5)
WBC: 13.3 10*3/uL — ABNORMAL HIGH (ref 4.0–10.5)
nRBC: 0 % (ref 0.0–0.2)

## 2021-02-27 LAB — CBG MONITORING, ED: Glucose-Capillary: 327 mg/dL — ABNORMAL HIGH (ref 70–99)

## 2021-02-27 LAB — LIPASE, BLOOD: Lipase: 38 U/L (ref 11–51)

## 2021-02-27 MED ORDER — ONDANSETRON HCL 4 MG/2ML IJ SOLN
4.0000 mg | Freq: Once | INTRAMUSCULAR | Status: AC
Start: 2021-02-27 — End: 2021-02-27
  Administered 2021-02-27: 4 mg via INTRAVENOUS
  Filled 2021-02-27 (×2): qty 2

## 2021-02-27 MED ORDER — SODIUM CHLORIDE 0.9 % IV BOLUS
1000.0000 mL | Freq: Once | INTRAVENOUS | Status: AC
  Administered 2021-02-27: 1000 mL via INTRAVENOUS

## 2021-02-27 MED ORDER — DICYCLOMINE HCL 20 MG PO TABS: 20.0000 mg | ORAL_TABLET | Freq: Two times a day (BID) | ORAL | 0 refills | Status: DC | PRN

## 2021-02-27 MED ORDER — IOHEXOL 300 MG/ML  SOLN
100.0000 mL | Freq: Once | INTRAMUSCULAR | Status: AC | PRN
  Administered 2021-02-27: 100 mL via INTRAVENOUS

## 2021-02-27 MED ORDER — HYDROMORPHONE HCL 1 MG/ML IJ SOLN
0.5000 mg | Freq: Once | INTRAMUSCULAR | Status: AC
Start: 2021-02-27 — End: 2021-02-27
  Administered 2021-02-27: 0.5 mg via INTRAVENOUS
  Filled 2021-02-27: qty 1

## 2021-02-27 NOTE — ED Triage Notes (Signed)
Pt with abd pain  x 2 days ago.  Denies any n/v/D.  Cbg 552 couple of hours ago.  Pt denies eating anything today.  Pt with c/o dizziness, hx of same.

## 2021-02-27 NOTE — Discharge Instructions (Signed)
Lab work was reassuring, imaging shows some concern for possible hepatic steatosis recommends limited ultrasound for further evaluation.  Given contact information above please call to schedule follow-up appointment for tomorrow.  Imaging also reveals that you have some enlarged periaortic and perigastric lymph nodes please follow-up with your PCP for further evaluation.  Please contact you PCP for continued management  of your elevated glucose levels to see if they would like to adjust your diabetes medication.  Come back to the emergency department if you develop chest pain, shortness of breath, severe abdominal pain, uncontrolled nausea, vomiting, diarrhea.

## 2021-02-27 NOTE — ED Provider Notes (Signed)
[facility] EMERGENCY DEPARTMENT Provider Note   CSN: [id number] Arrival date & time: 02/27/21  1152     History Chief Complaint  Patient presents with   Abdominal Pain    Shaun Williams is a 50 y.o. male.  HPI  Patient with significant medical history of diabetes, hypertension, TIA present with chief complaint of right flank and right lower quadrant pain.  Patient has been going on for last couple days, states the pain is constant, does not radiate, has had associated nausea and vomiting but denies hematemesis or coffee-ground emesis, states he has vomited once, he has no constipation, diarrhea, denies any urinary symptoms, denies testicular pain, penile discharge, urinary urgency, frequency, difficult urination.  He has no significant abdominal history, no history of stomach ulcers, significant NSAID use, diverticulitis, bowel obstruction, pancreatitis.  Does state that he used to drink heavily in the past but he is now ceased, he states he only drinks a few every now and then.  He has no associated fevers, chills, nasal ingestion, sore throat, cough, general body, denies any recent sick contacts.  He has no other complaints at this time.  He does note that he has had an elevated glucose level of 500s and was told to come here for further evaluation.  He states he is on Jardiance has been compliant with this, denies any recent changes, has no other complaints.  Past Medical History:  Diagnosis Date   Diabetes mellitus without complication ([place])    Hypertension     Patient Active Problem List   Diagnosis Date Noted   TIA (transient ischemic attack) 10/02/2019   Type 2 diabetes mellitus without complication ([place]) 37/02/6268   Essential hypertension 10/01/2019   Focal neurological deficit 10/01/2019    Past Surgical History:  Procedure Laterality Date   KNEE SURGERY     SHOULDER SURGERY         History reviewed. No pertinent family history.  Social History   Tobacco  Use   Smoking status: Every Day    Types: Cigarettes   Smokeless tobacco: Never  Substance Use Topics   Alcohol use: Yes   Drug use: No    Home Medications Prior to Admission medications   Medication Sig Start Date End Date Taking? Authorizing Provider  dicyclomine (BENTYL) 20 MG tablet Take 1 tablet (20 mg total) by mouth 2 (two) times daily as needed for spasms. 02/27/21  Yes Marcello Fennel, PA-C  JARDIANCE 25 MG TABS tablet Take 1 tablet (25 mg total) by mouth daily. 10/07/19  Yes Johnson, Clanford L, MD  lisinopril-hydrochlorothiazide (PRINZIDE,ZESTORETIC) 20-25 MG per tablet TAKE ONE TABLET BY MOUTH ONCE EVERY DAY Patient taking differently: Take 1 tablet by mouth daily. TAKE ONE TABLET BY MOUTH ONCE EVERY DAY 01/09/13  Yes Mikey Kirschner, MD  amLODipine (NORVASC) 5 MG tablet TAKE ONE TABLET BY MOUTH EVERY DAY Patient not taking: Reported on 02/27/2021 10/02/19   Murlean Iba, MD  aspirin EC 81 MG tablet Take 1 tablet (81 mg total) by mouth daily. Patient not taking: Reported on 02/27/2021 10/02/19   Murlean Iba, MD  atorvastatin (LIPITOR) 10 MG tablet Take 1 tablet (10 mg total) by mouth every evening. Patient not taking: Reported on 02/27/2021 10/02/19 11/01/19  Murlean Iba, MD    Allergies    Patient has no known allergies.  Review of Systems   Review of Systems  Constitutional:  Negative for chills and fever.  HENT:  Negative for congestion.  Respiratory:  Negative for shortness of breath.   Cardiovascular:  Negative for chest pain.  Gastrointestinal:  Positive for abdominal pain, nausea and vomiting. Negative for constipation and diarrhea.  Genitourinary:  Positive for frequency. Negative for dysuria and enuresis.  Musculoskeletal:  Negative for back pain.  Skin:  Negative for rash.  Neurological:  Negative for dizziness.  Hematological:  Does not bruise/bleed easily.   Physical Exam Updated Vital Signs BP 114/72   Pulse 85   Temp 98.8 F  (37.1 C)   Resp 18   Ht _0  (1.778 m)   Wt 105.7 kg   SpO2 97%   BMI 33.43 kg/m   Physical Exam Vitals and nursing note reviewed.  Constitutional:      General: He is not in acute distress.    Appearance: He is not ill-appearing.  HENT:     Head: Normocephalic and atraumatic.     Nose: No congestion.     Mouth/Throat:     Mouth: Mucous membranes are moist.  Eyes:     Conjunctiva/sclera: Conjunctivae normal.  Cardiovascular:     Rate and Rhythm: Normal rate and regular rhythm.     Pulses: Normal pulses.     Heart sounds: No murmur heard.   No friction rub. No gallop.  Pulmonary:     Effort: No respiratory distress.     Breath sounds: No wheezing, rhonchi or rales.  Abdominal:     General: There is no distension.     Palpations: Abdomen is soft.     Tenderness: There is abdominal tenderness. There is no right CVA tenderness or left CVA tenderness.     Comments: Abdomen nondistended, normal bowel sounds, dull to percussion, he had tenderness to palpation in his right lower quadrant, he also tenderness along his right side, negative McBurney point, Rovsing sign, no CVA tenderness, no peritoneal sign or guarding present my exam.  Musculoskeletal:     Right lower leg: No edema.     Left lower leg: No edema.  Skin:    General: Skin is warm and dry.  Neurological:     Mental Status: He is alert.  Psychiatric:        Mood and Affect: Mood normal.    ED Results / Procedures / Treatments   Labs (all labs ordered are listed, but only abnormal results are displayed) Labs Reviewed  COMPREHENSIVE METABOLIC PANEL - Abnormal; Notable for the following components:      Result Value   Sodium 131 (*)    Chloride 94 (*)    Glucose, Bld 344 (*)    BUN 29 (*)    ALT 48 (*)    All other components within normal limits  CBC - Abnormal; Notable for the following components:   WBC 13.3 (*)    All other components within normal limits  URINALYSIS, ROUTINE W REFLEX MICROSCOPIC -  Abnormal; Notable for the following components:   Glucose, UA >=500 (*)    Protein, ur 30 (*)    All other components within normal limits  BLOOD GAS, VENOUS - Abnormal; Notable for the following components:   Acid-Base Excess 2.9 (*)    All other components within normal limits  CBG MONITORING, ED - Abnormal; Notable for the following components:   Glucose-Capillary 327 (*)    All other components within normal limits  LIPASE, BLOOD    EKG None  Radiology CT ABDOMEN PELVIS W CONTRAST  Result Date: 02/27/2021 CLINICAL DATA:  RLQ pain x 2  days with nausea. Question appendicitis EXAM: CT ABDOMEN AND PELVIS WITH CONTRAST TECHNIQUE: Multidetector CT imaging of the abdomen and pelvis was performed using the standard protocol following bolus administration of intravenous contrast. CONTRAST:  167m OMNIPAQUE IOHEXOL 300 MG/ML  SOLN COMPARISON:  None. FINDINGS: Lower chest: Bilateral lower lobe subsegmental atelectasis. Coronary artery calcifications. Hepatobiliary: Question nodular hepatic contour. Prominent caudate lobe. The hepatic parenchyma is diffusely mildly hypodense compared to the splenic parenchyma consistent with fatty infiltration. No focal liver abnormality. No gallstones, gallbladder wall thickening, or pericholecystic fluid. No biliary dilatation. Pancreas: No focal lesion. Normal pancreatic contour. No surrounding inflammatory changes. No main pancreatic ductal dilatation. Spleen: Normal in size without focal abnormality. Adrenals/Urinary Tract: No adrenal nodule bilaterally. Bilateral kidneys enhance symmetrically. Subcentimeter hypodensities are too small to characterize. No hydronephrosis. No hydroureter. The urinary bladder is unremarkable. On delayed imaging, there is no urothelial wall thickening and there are no filling defects in the opacified portions of the bilateral collecting systems or ureters. Stomach/Bowel: Stomach is within normal limits. No evidence of bowel wall  thickening or dilatation. Few scattered colonic diverticula. Appendix appears normal. Vascular/Lymphatic: The main portal, splenic, superior mesenteric veins are patent. No abdominal aorta or iliac aneurysm. At least moderate atherosclerotic plaque of the aorta and its branches. A 1.3 cm perigastric lymph node (2:23). A 1.1 cm left periaortic lymph node (2:43). No pelvic or inguinal lymphadenopathy. Reproductive: Prostate is unremarkable. Other: No intraperitoneal free fluid. No intraperitoneal free gas. No organized fluid collection. Musculoskeletal: No abdominal wall hernia or abnormality. No suspicious lytic or blastic osseous lesions. No acute displaced fracture. Multilevel degenerative changes of the spine. IMPRESSION: 1. Normal appendix. 2. Hepatic steatosis with query of a cirrhotic morphology. Recommend right upper quadrant ultrasound for further evaluation. 3. Nonspecific borderline enlarged left periaortic and perigastric lymph nodes. Attention on follow-up. 4.  Aortic Atherosclerosis (ICD10-I70.0). Electronically Signed   By: MIven FinnM.D.   On: 02/27/2021 18:00    Procedures Procedures   Medications Ordered in ED Medications  sodium chloride 0.9 % bolus 1,000 mL (0 mLs Intravenous Stopped 02/27/21 1921)  HYDROmorphone (DILAUDID) injection 0.5 mg (0.5 mg Intravenous Given 02/27/21 1518)  ondansetron (ZOFRAN) injection 4 mg (4 mg Intravenous Given 02/27/21 1517)  iohexol (OMNIPAQUE) 300 MG/ML solution 100 mL (100 mLs Intravenous Contrast Given 02/27/21 1654)    ED Course  I have reviewed the triage vital signs and the nursing notes.  Pertinent labs & imaging results that were available during my care of the patient were reviewed by me and considered in my medical decision making (see chart for details).    MDM Rules/Calculators/A&P                          Initial impression-patient presents with right lower quadrant pain.  He is alert, does not appear in acute stress, vital  signs reassuring.  Unclear etiology, does have a history of diabetes and concern for possible DKA will obtain basic lab work-up, provide fluids pain medications and reassess.  Work-up-CBC shows slight leukocytosis of 13.3, CMP shows sodium of 131, chloride 94, glucose 344, ALT 48, lipase 38, VBG unremarkable, UA shows proteins, no nitrates, leukocytes, hematuria.  CT abdomen pelvis reveals normal appendix, hepatic steatosis, nonspecific borderline enlarged left area periaortic and perigastric lymph node.  Reassessment-patient was reassessed after lab work and update on these, he was given some pain medications, his pain has improved but he still tender in his right lower quadrant,  I am concerned for possible appendicitis will obtain further imaging for further evaluation.  Patient is reassessed, updated lab or imaging, he has no complaints this time, he is tolerant p.o., he is agreed for discharge at this time.  Rule out-I have low suspicion for UTI, [place], kidney stone as UA is negative for signs infection, no hematuria, CT imaging is negative for these findings.  I have low suspicion for pancreatitis as lipase is within normal limits.  I have low suspicion for bowel obstruction, complicated diverticulitis, appendicitis as imaging is negative for these findings.  I have low suspicion for DKA or HSS as his glucose is only mildly elevated, he has no anion gap, no decrease in his CO2, pH is within normal limits.  I have low suspicion for liver or gallbladder abnormality as he has no right upper quadrant tenderness, he has no elevation liver enzymes, alk phos, T bili, he is not jaundiced on my exam.  I have low suspicion for dissection of the aortic artery as presentation is atypical of etiology.  Plan-  Right lower quadrant pain improved-unclear etiology, possible patient  suffering from a viral infection gastritis which caused increase in his lymph nodes seen on his CT scan which could increase his white  count.  Will recommend over-the-counter pain medications, provide him with Bentyl,  Given strict return precautions. Hepatic stenosis-patient was notified of these findings on his CT scan he states that he used drink alcohol more frequently but has decreased on this, we will have him follow-up with an outpatient limited ultrasound for further evaluation. Large lymph nodes-patient was made aware of these findings warrant follow-up PCP for further evaluation. Hyperglycemia-patient made aware of his slightly elevated glucose levels, will have him continue to monitor this, follow-up as PCP for further management.   Vital signs have remained stable, no indication for hospital admission.    Patient given at home care as well strict return precautions.  Patient verbalized that they understood agreed to said plan.  Final Clinical Impression(s) / ED Diagnoses Final diagnoses:  Lower abdominal pain    Rx / DC Orders ED Discharge Orders          Ordered    US Abdomen Limited RUQ/Gall Gladder        02/27/21 1917    dicyclomine (BENTYL) 20 MG tablet  2 times daily PRN        02/27/21 1918             Marcello Fennel, PA-C 02/27/21 2222    Hayden Rasmussen, MD 02/28/21 1059

## 2021-02-28 ENCOUNTER — Ambulatory Visit (HOSPITAL_COMMUNITY)
Admission: RE | Admit: 2021-02-28 | Discharge: 2021-02-28 | Disposition: A | Discharge status: Home / Self Care | Payer: BC Managed Care – PPO | Source: Ambulatory Visit | Attending: Student | Admitting: Student

## 2021-02-28 DIAGNOSIS — R1011 Right upper quadrant pain: Secondary | ICD-10-CM | POA: Insufficient documentation

## 2021-02-28 NOTE — ED Provider Notes (Signed)
02/28/2021 10:29 AM Pt made aware of RUQ [place] results and need for f/u with pcp.    Karrie Meres, PA-C 02/28/21 1030    Rozelle Logan, [place] 03/01/21 [phone]

## 2021-04-07 IMAGING — MR MR MRA HEAD W/O CM
2 series · 15 of 48 positions shown · IV contrast (gadavist)
Comparison: None.

CLINICAL DATA: Right-sided weakness

EXAM:
MRI HEAD WITHOUT CONTRAST
MRA HEAD WITHOUT CONTRAST
MRA NECK WITHOUT AND WITH CONTRAST
TECHNIQUE: Multiplanar, multiecho pulse sequences of the brain and surrounding
structures were obtained without intravenous contrast. Angiographic
images of the Circle of Willis were obtained using MRA technique
without intravenous contrast. Angiographic images of the neck were
obtained using MRA technique without and with intravenous contrast.
Carotid stenosis measurements (when applicable) are obtained
utilizing NASCET criteria, using the distal internal carotid
diameter as the denominator.
CONTRAST:  10mL GADAVIST GADOBUTROL 1 MMOL/ML IV SOLN

[Series 1: TOF fat-sat · axial · 0.8mm · 0.38mm/px · z∈[-110,-2]mm · 14 of 143 slices shown]
[im 1/143]
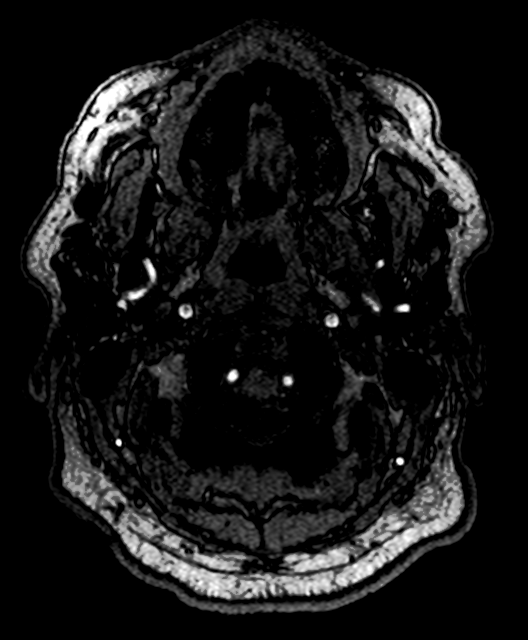
[im 4/143]
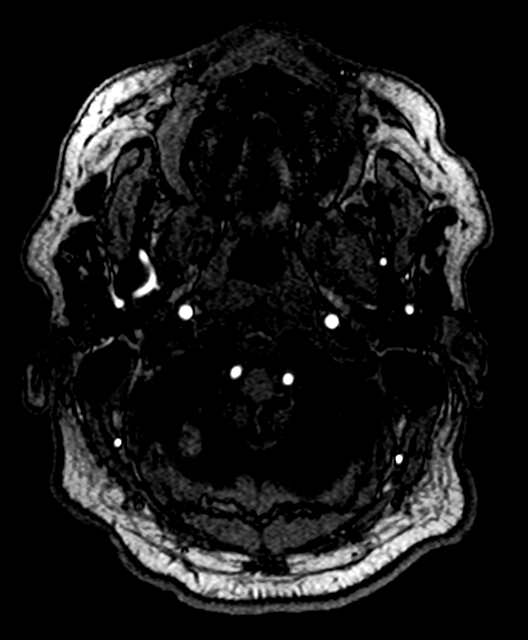
[im 7/143]
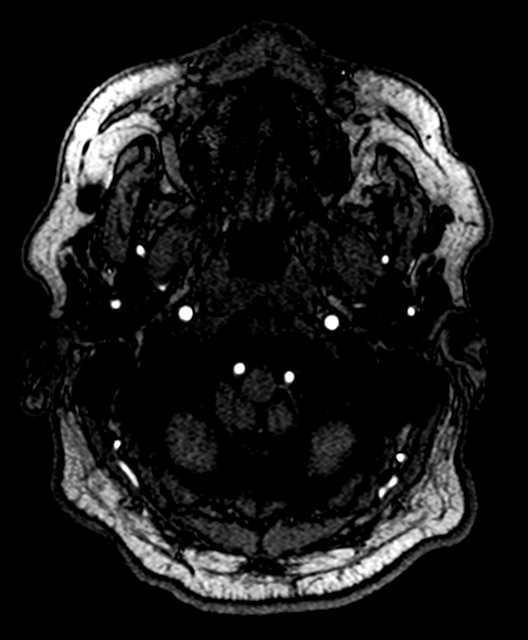
[im 10/143]
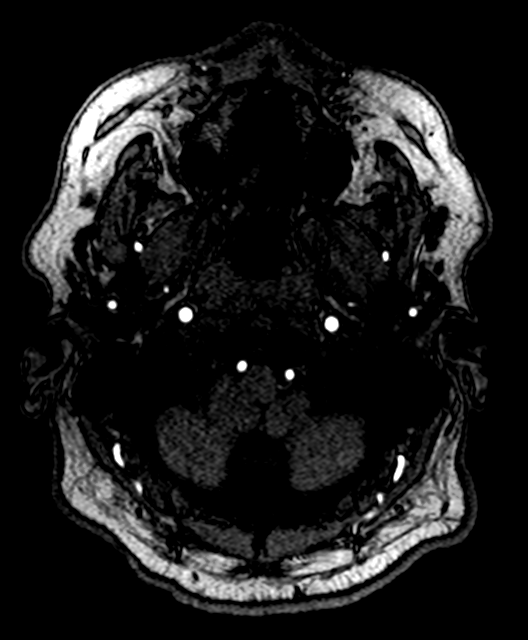
[im 22/143]
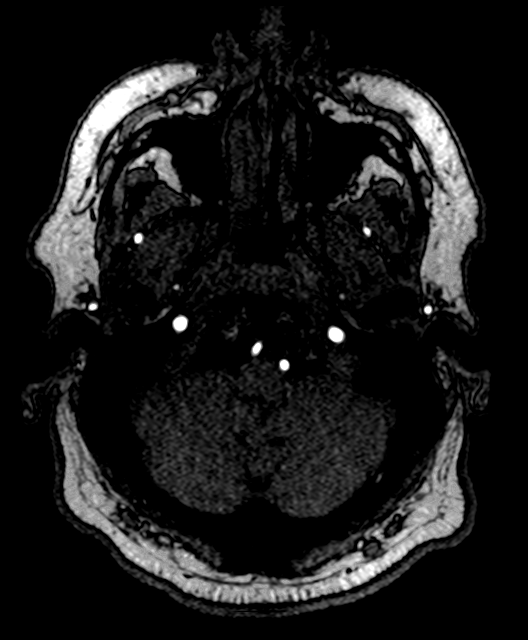
[im 25/143]
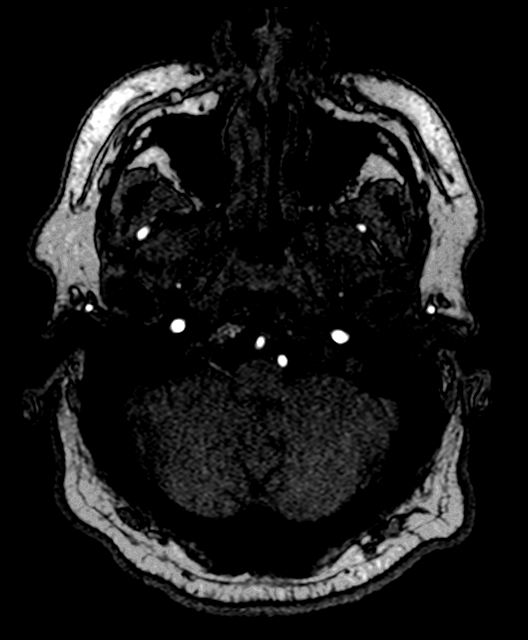
[im 44/143]
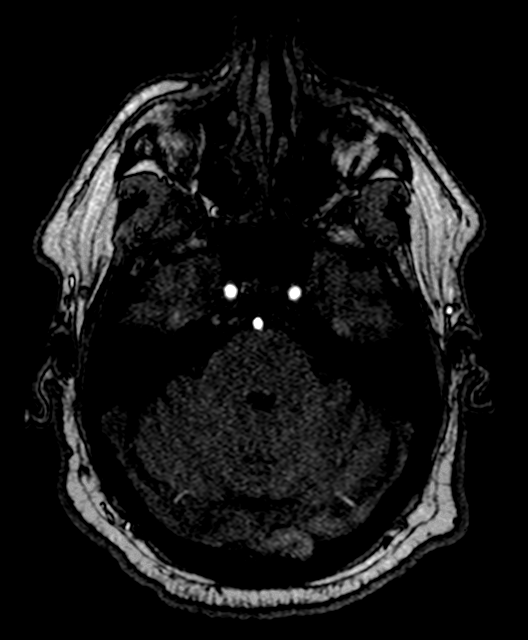
[im 62/143]
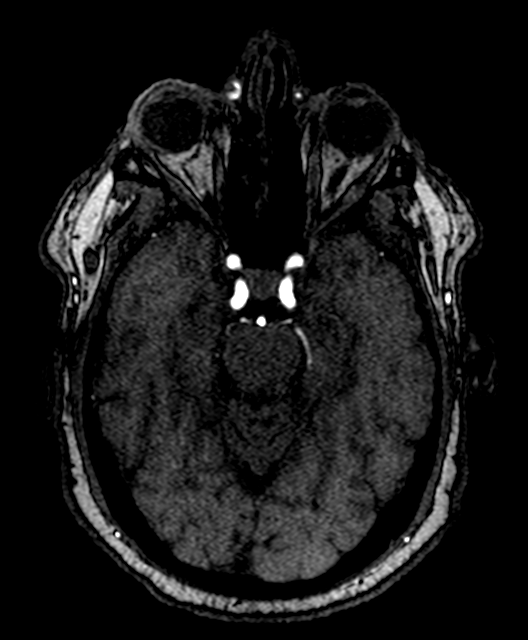
[im 72/143]
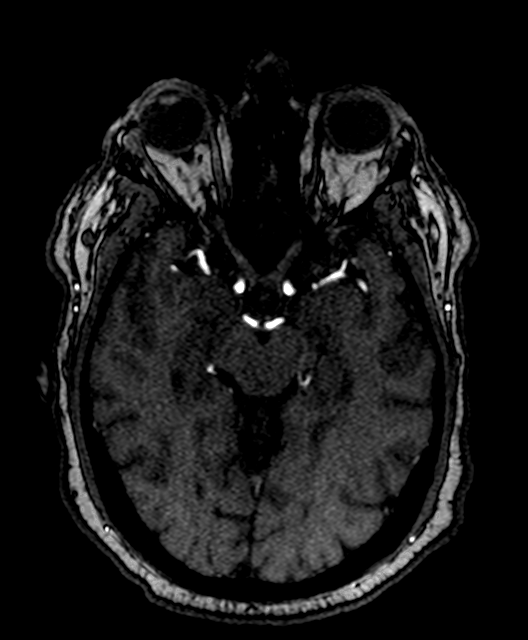
[im 81/143]
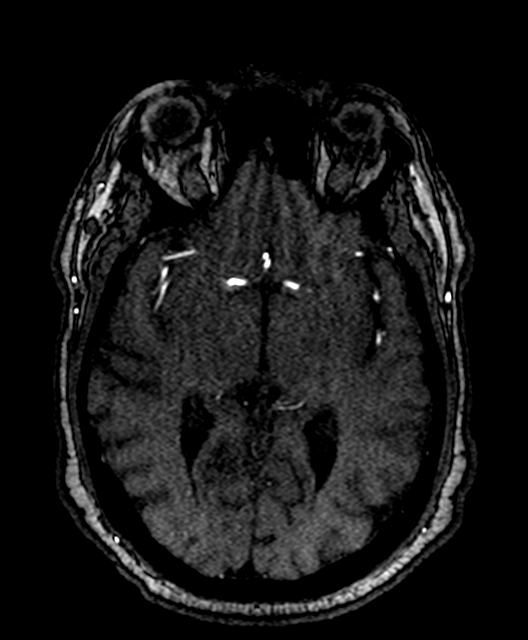
[im 99/143]
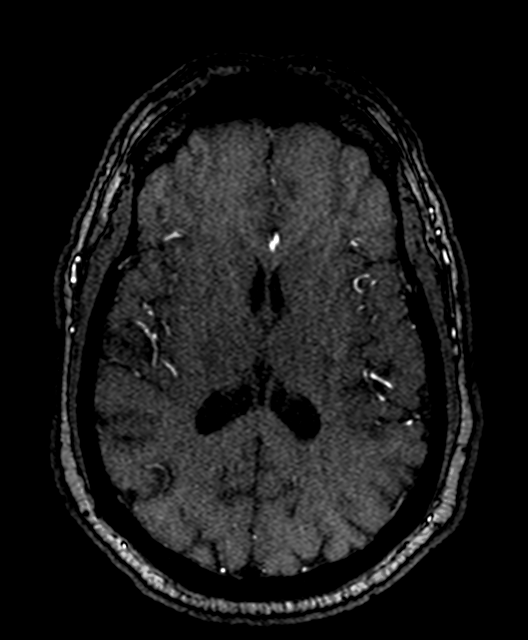
[im 118/143]
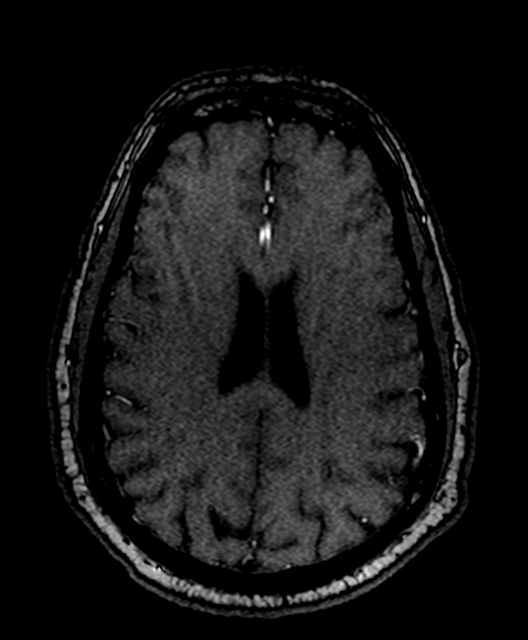
[im 121/143]
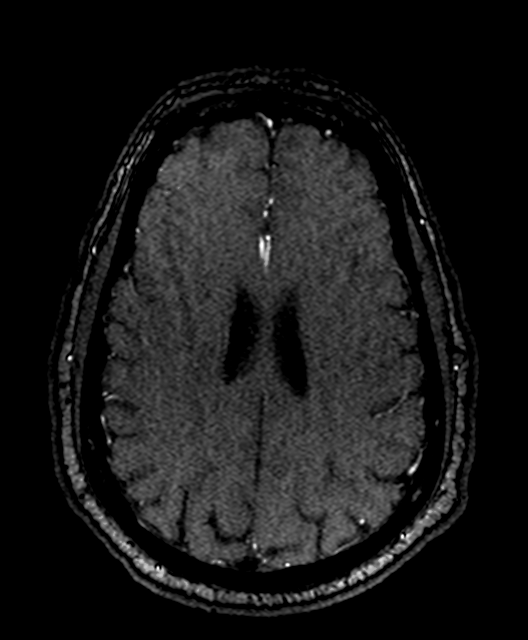
[im 136/143]
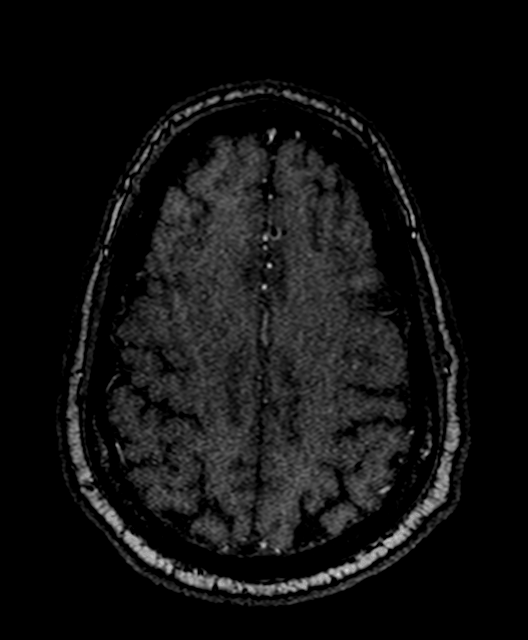

[Series 102: <mip range(2)> · axial · 0.28mm/px · 1 of 1 slices shown]
[im 1/1]
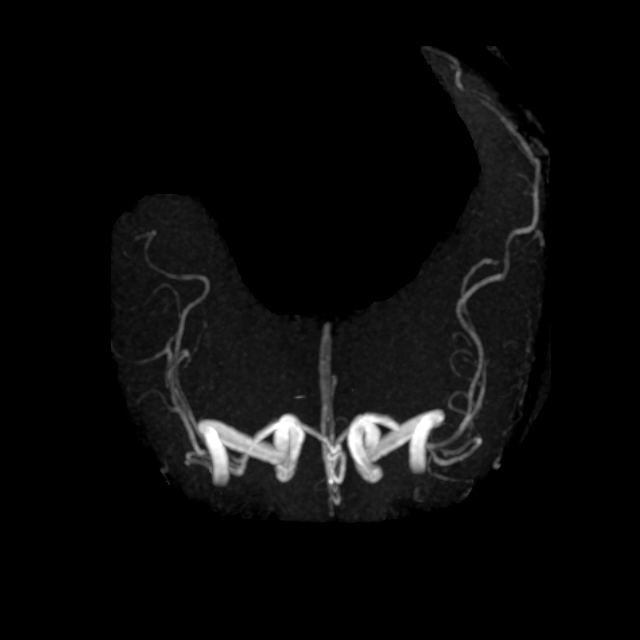

[15 of 48 positions shown; findings below may reference images not displayed]

FINDINGS: MRI HEAD

Brain: There is no acute infarction or intracranial hemorrhage.
There is no intracranial mass, mass effect, or edema. There is no
hydrocephalus or extra-axial fluid collection. Ventricles and sulci
are within normal limits in size and configuration. Minimal foci of
T2 hyperintensity in the supratentorial white matter likely reflect
nonspecific gliosis/demyelination of doubtful clinical significance.

Vascular: Major vessel flow voids at the skull base are preserved.

Skull and upper cervical spine: Normal marrow signal is preserved.

Sinuses/Orbits: Paranasal sinuses are aerated. Orbits are
unremarkable.

Other: Sella is unremarkable.  Mastoid air cells are clear.

MRA HEAD

Intracranial internal carotid arteries are patent. Middle and
anterior cerebral arteries are patent. Intracranial vertebral
arteries, basilar artery, posterior cerebral arteries are patent.
There is no significant stenosis or aneurysm.

MRA NECK

Common, internal, and external carotid arteries are patent.
Extracranial vertebral arteries are patent and codominant. No
hemodynamically significant stenosis.
IMPRESSION: No evidence of acute infarction, hemorrhage, or mass.

No large vessel occlusion or hemodynamically significant stenosis.

## 2022-06-25 ENCOUNTER — Other Ambulatory Visit: Payer: Self-pay

## 2022-06-25 ENCOUNTER — Emergency Department (HOSPITAL_COMMUNITY): Payer: BC Managed Care – PPO

## 2022-06-25 ENCOUNTER — Encounter (HOSPITAL_COMMUNITY): Payer: Self-pay

## 2022-06-25 ENCOUNTER — Inpatient Hospital Stay (HOSPITAL_COMMUNITY)
Admission: EM | Admit: 2022-06-25 | Discharge: 2022-06-26 | DRG: 103 | Disposition: A | Discharge status: Home / Self Care | Payer: BC Managed Care – PPO | Attending: Neurology | Admitting: Neurology

## 2022-06-25 DIAGNOSIS — G8191 Hemiplegia, unspecified affecting right dominant side: Secondary | ICD-10-CM | POA: Diagnosis present

## 2022-06-25 DIAGNOSIS — E669 Obesity, unspecified: Secondary | ICD-10-CM | POA: Diagnosis present

## 2022-06-25 DIAGNOSIS — E785 Hyperlipidemia, unspecified: Secondary | ICD-10-CM | POA: Diagnosis present

## 2022-06-25 DIAGNOSIS — Z7982 Long term (current) use of aspirin: Secondary | ICD-10-CM

## 2022-06-25 DIAGNOSIS — I639 Cerebral infarction, unspecified: Principal | ICD-10-CM

## 2022-06-25 DIAGNOSIS — I10 Essential (primary) hypertension: Secondary | ICD-10-CM | POA: Diagnosis present

## 2022-06-25 DIAGNOSIS — R29810 Facial weakness: Secondary | ICD-10-CM | POA: Diagnosis present

## 2022-06-25 DIAGNOSIS — F17210 Nicotine dependence, cigarettes, uncomplicated: Secondary | ICD-10-CM | POA: Diagnosis present

## 2022-06-25 DIAGNOSIS — M1990 Unspecified osteoarthritis, unspecified site: Secondary | ICD-10-CM | POA: Diagnosis present

## 2022-06-25 DIAGNOSIS — Z79899 Other long term (current) drug therapy: Secondary | ICD-10-CM

## 2022-06-25 DIAGNOSIS — G459 Transient cerebral ischemic attack, unspecified: Secondary | ICD-10-CM | POA: Diagnosis not present

## 2022-06-25 DIAGNOSIS — E119 Type 2 diabetes mellitus without complications: Secondary | ICD-10-CM | POA: Diagnosis present

## 2022-06-25 DIAGNOSIS — Z6832 Body mass index (BMI) 32.0-32.9, adult: Secondary | ICD-10-CM | POA: Diagnosis not present

## 2022-06-25 DIAGNOSIS — Z823 Family history of stroke: Secondary | ICD-10-CM | POA: Diagnosis not present

## 2022-06-25 DIAGNOSIS — R2990 Unspecified symptoms and signs involving the nervous system: Secondary | ICD-10-CM | POA: Diagnosis not present

## 2022-06-25 DIAGNOSIS — Z8673 Personal history of transient ischemic attack (TIA), and cerebral infarction without residual deficits: Secondary | ICD-10-CM | POA: Diagnosis not present

## 2022-06-25 DIAGNOSIS — G43409 Hemiplegic migraine, not intractable, without status migrainosus: Principal | ICD-10-CM | POA: Diagnosis present

## 2022-06-25 DIAGNOSIS — Z7984 Long term (current) use of oral hypoglycemic drugs: Secondary | ICD-10-CM

## 2022-06-25 DIAGNOSIS — Z96653 Presence of artificial knee joint, bilateral: Secondary | ICD-10-CM | POA: Diagnosis present

## 2022-06-25 LAB — COMPREHENSIVE METABOLIC PANEL
ALT: 26 U/L (ref 0–44)
AST: 17 U/L (ref 15–41)
Albumin: 4.3 g/dL (ref 3.5–5.0)
Alkaline Phosphatase: 49 U/L (ref 38–126)
Anion gap: 12 (ref 5–15)
BUN: 14 mg/dL (ref 6–20)
CO2: 26 mmol/L (ref 22–32)
Calcium: 9.1 mg/dL (ref 8.9–10.3)
Chloride: 101 mmol/L (ref 98–111)
Creatinine, Ser: 0.91 mg/dL (ref 0.61–1.24)
GFR, Estimated: >60 mL/min (ref >60)
Glucose, Bld: 139 mg/dL — ABNORMAL HIGH (ref 70–99)
Potassium: 3.6 mmol/L (ref 3.5–5.1)
Sodium: 139 mmol/L (ref 135–145)
Total Bilirubin: 0.6 mg/dL (ref 0.3–1.2)
Total Protein: 7.7 g/dL (ref 6.5–8.1)

## 2022-06-25 LAB — PROTIME-INR
INR: 1 (ref 0.8–1.2)
Prothrombin Time: 13.2 seconds (ref 11.4–15.2)

## 2022-06-25 LAB — CBG MONITORING, ED: Glucose-Capillary: 133 mg/dL — ABNORMAL HIGH (ref 70–99)

## 2022-06-25 LAB — URINALYSIS, ROUTINE W REFLEX MICROSCOPIC
Bacteria, UA: NONE SEEN
Bilirubin Urine: NEGATIVE
Glucose, UA: >=500 mg/dL — AB
Hgb urine dipstick: NEGATIVE
Ketones, ur: NEGATIVE mg/dL
Leukocytes,Ua: NEGATIVE
Nitrite: NEGATIVE
Protein, ur: NEGATIVE mg/dL
Specific Gravity, Urine: 1.011 (ref 1.005–1.030)
pH: 5 (ref 5.0–8.0)

## 2022-06-25 LAB — I-STAT CHEM 8, ED
BUN: 13 mg/dL (ref 6–20)
Calcium, Ion: 1.13 mmol/L — ABNORMAL LOW (ref 1.15–1.40)
Chloride: 102 mmol/L (ref 98–111)
Creatinine, Ser: 0.9 mg/dL (ref 0.61–1.24)
Glucose, Bld: 134 mg/dL — ABNORMAL HIGH (ref 70–99)
HCT: 48 % (ref 39.0–52.0)
Hemoglobin: 16.3 g/dL (ref 13.0–17.0)
Potassium: 3.5 mmol/L (ref 3.5–5.1)
Sodium: 140 mmol/L (ref 135–145)
TCO2: 25 mmol/L (ref 22–32)

## 2022-06-25 LAB — CBC
HCT: 45.8 % (ref 39.0–52.0)
Hemoglobin: 15.4 g/dL (ref 13.0–17.0)
MCH: 31.7 pg (ref 26.0–34.0)
MCHC: 33.6 g/dL (ref 30.0–36.0)
MCV: 94.2 fL (ref 80.0–100.0)
Platelets: 230 10*3/uL (ref 150–400)
RBC: 4.86 MIL/uL (ref 4.22–5.81)
RDW: 13.7 % (ref 11.5–15.5)
WBC: 12.9 10*3/uL — ABNORMAL HIGH (ref 4.0–10.5)
nRBC: 0 % (ref 0.0–0.2)

## 2022-06-25 LAB — DIFFERENTIAL
Abs Immature Granulocytes: 0.05 10*3/uL (ref 0.00–0.07)
Basophils Absolute: 0.1 10*3/uL (ref 0.0–0.1)
Basophils Relative: 1 %
Eosinophils Absolute: 0.2 10*3/uL (ref 0.0–0.5)
Eosinophils Relative: 1 %
Immature Granulocytes: 0 %
Lymphocytes Relative: 26 %
Lymphs Abs: 3.3 10*3/uL (ref 0.7–4.0)
Monocytes Absolute: 1 10*3/uL (ref 0.1–1.0)
Monocytes Relative: 8 %
Neutro Abs: 8.4 10*3/uL — ABNORMAL HIGH (ref 1.7–7.7)
Neutrophils Relative %: 64 %

## 2022-06-25 LAB — RAPID URINE DRUG SCREEN, HOSP PERFORMED
Amphetamines: NOT DETECTED
Barbiturates: NOT DETECTED
Benzodiazepines: NOT DETECTED
Cocaine: NOT DETECTED
Opiates: NOT DETECTED
Tetrahydrocannabinol: NOT DETECTED

## 2022-06-25 LAB — ETHANOL: Alcohol, Ethyl (B): <10 mg/dL (ref <10)

## 2022-06-25 LAB — GLUCOSE, CAPILLARY: Glucose-Capillary: 98 mg/dL (ref 70–99)

## 2022-06-25 LAB — APTT: aPTT: 30 seconds (ref 24–36)

## 2022-06-25 MED ORDER — TENECTEPLASE FOR STROKE
PACK | INTRAVENOUS | Status: AC
  Administered 2022-06-25: 25 mg via INTRAVENOUS
  Filled 2022-06-25: qty 10

## 2022-06-25 MED ORDER — SENNOSIDES-DOCUSATE SODIUM 8.6-50 MG PO TABS: 1.0000 | ORAL_TABLET | Freq: Every evening | ORAL | Status: DC | PRN

## 2022-06-25 MED ORDER — TENECTEPLASE FOR STROKE: 0.2500 mg/kg | PACK | Freq: Once | INTRAVENOUS | Status: AC

## 2022-06-25 MED ORDER — INSULIN ASPART 100 UNIT/ML IJ SOLN
0.0000 [IU] | Freq: Three times a day (TID) | INTRAMUSCULAR | Status: DC
  Administered 2022-06-26: 2 [IU] via SUBCUTANEOUS
  Administered 2022-06-26: 3 [IU] via SUBCUTANEOUS

## 2022-06-25 MED ORDER — STROKE: EARLY STAGES OF RECOVERY BOOK
Freq: Once | Status: AC
  Filled 2022-06-25: qty 1

## 2022-06-25 MED ORDER — PANTOPRAZOLE SODIUM 40 MG IV SOLR
40.0000 mg | Freq: Every day | INTRAVENOUS | Status: DC
  Administered 2022-06-25: 40 mg via INTRAVENOUS
  Filled 2022-06-25: qty 10

## 2022-06-25 MED ORDER — ACETAMINOPHEN 325 MG PO TABS
650.0000 mg | ORAL_TABLET | ORAL | Status: DC | PRN
  Administered 2022-06-25 – 2022-06-26 (×2): 650 mg via ORAL
  Filled 2022-06-25 (×2): qty 2

## 2022-06-25 MED ORDER — ACETAMINOPHEN 650 MG RE SUPP: 650.0000 mg | RECTAL | Status: DC | PRN

## 2022-06-25 MED ORDER — ACETAMINOPHEN 160 MG/5ML PO SOLN: 650.0000 mg | ORAL | Status: DC | PRN

## 2022-06-25 MED ORDER — CHLORHEXIDINE GLUCONATE CLOTH 2 % EX PADS
6.0000 | MEDICATED_PAD | Freq: Every day | CUTANEOUS | Status: DC
  Administered 2022-06-25: 6 via TOPICAL

## 2022-06-25 MED ORDER — IOHEXOL 350 MG/ML SOLN
75.0000 mL | Freq: Once | INTRAVENOUS | Status: AC | PRN
  Administered 2022-06-25: 75 mL via INTRAVENOUS

## 2022-06-25 NOTE — ED Notes (Signed)
0708  Cart activated for pt arriving POV reporting he woke up at 3am with HA  and at 5 am noted r hand arm leg and face numbness and tingling.  Pt reporting going to bed around 11pm.  Fang D neg.  MRS 0.   0712 Pt to CT 0719 Pt returns from [place] [facility] [address] Dale [place] joins cart

## 2022-06-25 NOTE — ED Notes (Signed)
0740 Neg CT results reported to Dr Dale [place]

## 2022-06-25 NOTE — Consult Note (Addendum)
[facility] Consult [facility]  TeleStroke Metrics: Last Known Well: 06/25/2022 0500   [facility] Notification Time: 06/25/2022 [id number]  Stamp Time: 06/25/2022 0722 Initial Response Time: 06/25/2022 0729   Arrival Time/Door Time: 06/25/2022 0647 Initial Patient Interaction Time: 06/25/2022 0731 NIHSS Assessment Completed Time: 06/25/2022 0743   Thrombolytic Medical Decision Time: 06/25/2022 0752 mRS: 0  There was a delay in IV tenecteplase administration due to activation delay, not obtaining the patient's accurate weight upfront, and medical decision making from the patient and his wife.   Interventional Candidate: Not a candidate as his overall symptoms are not consistent with a large vessel proximal occlusion, and NIH stroke scale score is currently less than 6. Discussed with Neurointerventionalist at: Not applicable   ED Physician notified of diagnostic impression and management plan at: 06/25/2022 0815  Chief Complaint: Headache and acute right-sided weakness/numbness  HPI: Asked to see this patient in emergent telemedicine consultation utilizing interactive audio and video technologies. Consultation was performed with assistance of ancillary / medical staff at bedside. Verbal consent to perform the examination with telemedicine was obtained. Patient agreed to proceed with the consultation for acute stroke protocol.  52 year old right-handed white male who comes to the emergency room by private vehicle with his wife after he developed acute right-sided weakness and numbness.  Wife is currently at bedside.  Patient currently does not take any blood thinners or aspirin.  He is on medications for high blood pressure and diabetes mellitus.  He denies any history for migraine headaches or headaches in general.  Review of the medical record shows that this appears to be the patient's third episode of right-sided symptoms.  Patient came to the emergency room back on  08/28/2012 when he noted acute right hemibody numbness and also noted some drooling out of the right side of his mouth when he was drinking some shots.  Initial head CT was negative.  Alcohol level was 205.  Only significant finding on exam was a slight right facial droop and his right eye did not close completely.  He was treated for possible right-sided Bell's palsy, and was given IV Solu-Medrol x 1 and discharged with prednisone prescription.  Patient does not remember if he had an associated headache with this episode.  Then patient came to the ER on 10/01/2019 when he had acute right hemibody numbness again with associated mild right leg weakness.  Symptoms occurred sometime around 2 PM but he came to the emergency room by 7 PM.  Patient was seen by teleneurology where NIH stroke scale score was 2 for right-sided numbness and right leg weakness.  He was not given IV thrombolytics as he was outside the 4.5 hour timeframe.  Initial head CT was negative.  He was admitted to the hospital where MRI brain showed no acute stroke.  MRA of the head and neck were negative.  Echocardiogram showed normal ejection fraction, no PFO, normal left and right atrial sizes, and grossly intact mitral valve and aortic valve.  Hemoglobin A1c was 7.5% at the time and LDL was 83.  According to the discharge summary, patient was discharged on a baby aspirin and low-dose atorvastatin.  However, patient and his wife  recalled not getting any new prescriptions and was not made aware that he needed to be on aspirin and a statin.  Wife did mention that the patient complained of an episode of right hand numbness yesterday.  However, the patient thinks that this episode of right hand numbness was due to being cold  outside, as after he got back in and warmed up his right hand numbness had resolved.  Patient denied that his right hand numbness was persistent since yesterday.  He went to bed around 11 PM at his baseline.  His alarm clock woke  him up around 3:30 AM.  As he was waking up, he noted a 6-7/10 headache around the posterior parietal/occipital region.  He mostly described it as a dull pain.  No associated photophobia or nausea.  He denied waking up due to the headache, and it was not the worst headache of his life.  He was able to get ready for work.  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  He noted that shortly after arriving to work around 5 AM, he had acute right facial numbness.  Then as he was trying to write some things down on paper, he noted right hand weakness and trouble writing.  He also noted associated right hand numbness and tingling.  Eventually the numbness did go into his right leg.  He had no problems walking, but wife mentioned that it did appear he was dragging his right leg somewhat.  Patient also had reported right eye blurry vision.  During this time of his acute right-sided symptoms, his headache was a little bit better at 5-6/10 on pain scale.  He did take some Tylenol.  He denied any associated chest pain or dizziness.  On exam, patient is alert and oriented x 3.  No significant slurred speech or aphasia.  No vision abnormalities.  He had mild right arm and right leg drifting.  He still notes right face, arm, leg numbness.  He currently notes stable 5/10 headache.  Head CT was negative.  Blood pressure and vital signs were stable.  Blood sugar was stable as well.  Thrombolytic Administration: Risk and benefit of thrombolytics were discussed. Risk includes a 2-3% chance of symptomatic intracranial hemorrhage. Benefit includes an approximate 30% chance of improving. Inclusion criteria were reviewed. Exclusion criteria were reviewed and are all negative.  No recent issues with internal bleeding.  No recent surgeries.  Verbal Consent to Thrombolytics: I have explained to the patient and family the nature of the patient's condition, the use of thrombolytics as a fibrinolytic agent, and the benefits to be reasonably  expected compared with alternative approaches. I have discussed the likelihood of major risks or complications of this procedure including (if applicable) but not limited to loss of limb function, brain damage, paralysis, hemorrhage, infection, complications from transfusion of blood components, drug reactions, blood clots and loss of life. I have also indicated that with any procedure there is always the possibility of an unexpected complication. I have explained the risks which include: 1. Death, Stroke or permanent neurologic injury (paralysis, coma, etc) 2. Worsening of stroke symptoms from swelling or bleeding in the brain 3. Bleeding in other parts of the body 4. Need for blood transfusions to replace blood or clotting factors 5. Allergic reaction to medications 6. Other unexpected complications  All questions were answered and the patient and family expressed understanding of the treatment plan and consented to the procedure.  Verbal Consent: [id number]  Thrombolytic Ordered: [id number] Thrombolytic Total Dose: 25 mg (5 mL) given at 0759  Vial of thrombolytic was visually confirmed by myself. Dosing of the thrombolytic was reviewed by myself and with the ER nurse prior to administration. Blood pressure Pre-thrombolytic Administration: 131/85 at 0758  PMH: Hypertension Diabetes mellitus Osteoarthritis Possible left MCA TIAs in April 2014 and May 2021  Past surgical history: Bilateral knee replacements Right shoulder surgery  SOC: Positive for tobacco abuse and occasional alcohol use.  No illicit drug use.  Patient is married.  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  [place]: Mother with a stroke before.  ROS: 13 point review of systems were reviewed with the patient and his wife, and are all negative with the exception of the aforementioned in the history of present illness.  VS: Temperature 98.1 F, pulse 86, respiration 15, blood pressure 127/87, oxygen saturation 100%, weight 101  kg  Exam: Patient is in no apparent distress.  Patient appears as stated age.  No obvious acute respiratory or cardiac distress.  Patient is well groomed and well-nourished. 1a- LOC: Keenly responsive - 0 1b- LOC questions: Answers both questions correctly - 0 1c- LOC commands- Performs both tasks correctly- 0 2- Gaze: Normal; no gaze paresis or gaze deviation - 0 3- Visual Fields: normal, no Visual field deficit - 0 4- Facial movements: no facial palsy - 0 5- Upper limb motor - Right arm drift - 1 6- Lower limb motor - Right leg drift - 1 7- Limb Coordination: absent ataxia - 0 8- Sensory: Right hemisensory loss - 1 9- Language - No aphasia - 0 10- Speech - No dysarthria -0 11- Neglect / Extinction - none found - 0 NIHSS score: 3  Diagnostic Data: CT of the head showed no acute intracranial hemorrhage, mass, or large territory stroke  Last echocardiogram in May 2021 showed an ejection fraction of 60 to 65%, no segmental wall motion abnormalities, no obvious PFO, normal left and right atrial sizes, and grossly intact mitral valve and aortic valve.  Sodium 140, potassium 3.5, BUN 13, creatinine 0.9, blood glucose 139, LFTs within normal limits Alcohol level negative WBC 12.9, hemoglobin 15.4, platelets 230 Coagulation studies within normal limits  Medical Data Reviewed: 1.Data?reviewed include clinical labs, radiology,?and medical tests; 2.Tests?results discussed w/performing or interpreting physician; 3.Obtaining/reviewing old medical records; 4.Obtaining?case history from another source; 5.Independent?review of image, tracing, or specimen.  Medical Decision Making: - Extensive number of diagnosis or management options are considered below. - Extensive amount of complex data reviewed. - High risk of complication and/or morbidity or mortality are associated with differential diagnostic considerations below. - There may be?uncertain?outcome and increased probability of prolonged  functional impairment or high probability of severe prolonged functional impairment associated with some of these differential diagnosis.  Differential Diagnosis for Stroke: 1.?Cardioembolic?stroke 2. Small vessel disease/lacune 3. Thromboembolic, artery-to-artery mechanism 4.?Hypercoagulable?state-related infarct 5. Transient ischemic attack 6. Thrombotic mechanism, large artery disease  Assessment: 1.  Acute left MCA subcortical stroke status post IV tenecteplase 2.  Possible left MCA TIAs in April 2014 and May 2021 3.  Tobacco abuse 4.  Hypertension 5.  Diabetes mellitus 6.  Osteoarthritis  Recommendations: Patient should be admitted to the inpatient hospitalist service and be monitored in the ICU. Monitor and document vital signs/blood pressure with neuro checks/NIHSS for the first 24 hours after receiving IV thrombolytic using the following parameters:     -Check every 15 minutes for 2 hours following IV thrombolytic administration.     -Then check every 30 minutes for 6 hours.     -Then check every 1 hour for 16 hours. Allow permissive hypertension, but maintain SBP < 180 and DBP < 105. Increase the frequency of BP measurements if SBP > 180 or DBP > 105. If SBP > 180 or DBP > 105, notify MD and can administer IV anti-hypertensive medications until parameters are  achieved.  Options include:      -Labetalol 10-20 mg IV over 1-2 minutes, may repeat x 1; or      -Nicardipine drip at 5 mg/h and can titrate up by 2.5 mg/h every 5-15 minutes, max. 15 mg/h; or       -Clevidipine at 1-2 mg/h, titrate by doubling dose every 2-5 minutes, max 21 mg/h. Delay placement of nasogastric tubes, foley catheters, or intra-arterial pressure catheters if the patient can be safely managed without them. Place SCDs for DVT prevention. No anti-platelets or anti-coagulants or Lovenox for 24 hours s/p IV thrombolytic administration. Obtain follow-up head CT or brain MRI scan at 24 hours s/p IV  thrombolytic administration before starting anti-coagulant or anti-platelet medications. Obtain STAT head CT for any new acute headache or new neurological deficits. Continue telemetry monitoring to look for paroxysmal atrial fibrillation. Check echocardiogram.  Check brain MRI. Will go ahead and check a formal CTA of the head and neck to better evaluate his intracranial and extracranial blood vessels, with particular attention to the left carotid system given his recurrent left hemisphere symptoms. Consult PT, OT, and ST. Check hemoglobin A1c and lipid panel. Would recommend to consult local Neurology provider to see patient in follow-up consultation on the floor. Plan of care was discussed with the patient and his wife.  Case discussed with the ER staff and Dr. Melina Copa.   Thank you for allowing [facility] to participate in the care of your patient. Please call me, Dr. Dale [place], with any questions via the [facility] center at [phone].   For Inpatient follow-up with [facility] physician please call [facility] at [phone]. This is not an outpatient service. Post hospital discharge, please contact the hospital directly.  Critical Care notation: I was called to see this critical patient emergently. I personally evaluated this critical patient for acute stroke evaluation, and determining their eligibility for IV thrombolytics and interventional therapies.  I have spent approximately 39 minutes with the patient, including time at bedside, time discussing the case with other physicians, reviewing plan of care, and time independently reviewing the records and scans.   Addendum on 06/25/2022 at 0909: CTA head showed patent proximal bilateral MCA vessels, basilar artery, and bilateral vertebral arteries.  Left ICA showed no significant severe stenosis.  Formal report is pending. Patient is therefore still not an interventional candidate.

## 2022-06-25 NOTE — Progress Notes (Signed)
Code stroke CT times [id number] call [id number] exam started 0717 exam ended 0720 exam completed in [place] [facility] called

## 2022-06-25 NOTE — ED Provider Notes (Signed)
[facility] Provider Note   CSN: [id number] Arrival date & time: 06/25/22  [id number]     History  Chief Complaint  Patient presents with   Code Stroke    Shaun Williams is a 52 y.o. male.  He has a history of diabetes hypertension smoking.  He said he had a mini stroke a few years ago.  Went to bed around 11 PM last night and woke up at 330 to get ready for work.  Had a headache 6 out of 10 intensity not worst headache of life did not wake him up.  Around 5:30 AM noticed some numbness in his right face right hand and some difficulty writing.  Felt weak in his legs right worse than left and some blurriness in his right eye worse than usual.  Took some Tylenol for his headache.  Ultimately presented here a little before 7, code stroke was activated as possibly within window.  On review of medical records was seen in 5/21 for similar distribution right face arm leg numbness and some possible right-sided weakness.  No tPA was given at that time due to being outside window.  Patient states he had a recent sinus infection and course of antibiotics otherwise has been feeling well.  No recent bleeding no recent surgeries.  The history is provided by the patient and the spouse.  Cerebrovascular Accident This is a new problem. The current episode started 3 to 5 hours ago. The problem occurs constantly. The problem has not changed since onset.Associated symptoms include headaches. Pertinent negatives include no chest pain, no abdominal pain and no shortness of breath. Nothing aggravates the symptoms. Nothing relieves the symptoms. He has tried acetaminophen for the symptoms. The treatment provided no relief.       Home Medications Prior to Admission medications   Medication Sig Start Date End Date Taking? Authorizing Provider  amLODipine (NORVASC) 5 MG tablet TAKE ONE TABLET BY MOUTH EVERY DAY Patient not taking: Reported on 02/27/2021 10/02/19   Murlean Iba, MD  aspirin EC 81 MG tablet Take 1 tablet (81 mg total) by mouth daily. Patient not taking: Reported on 02/27/2021 10/02/19   Murlean Iba, MD  atorvastatin (LIPITOR) 10 MG tablet Take 1 tablet (10 mg total) by mouth every evening. Patient not taking: Reported on 02/27/2021 10/02/19 11/01/19  Murlean Iba, MD  dicyclomine (BENTYL) 20 MG tablet Take 1 tablet (20 mg total) by mouth 2 (two) times daily as needed for spasms. 02/27/21   Marcello Fennel, PA-C  JARDIANCE 25 MG TABS tablet Take 1 tablet (25 mg total) by mouth daily. 10/07/19   Johnson, Clanford L, MD  lisinopril-hydrochlorothiazide (PRINZIDE,ZESTORETIC) 20-25 MG per tablet TAKE ONE TABLET BY MOUTH ONCE EVERY DAY Patient taking differently: Take 1 tablet by mouth daily. TAKE ONE TABLET BY MOUTH ONCE EVERY DAY 01/09/13   Mikey Kirschner, MD      Allergies    Patient has no known allergies.    Review of Systems   Review of Systems  Constitutional:  Negative for fever.  HENT:  Negative for sore throat.   Eyes:  Positive for visual disturbance.  Respiratory:  Negative for shortness of breath.   Cardiovascular:  Negative for chest pain.  Gastrointestinal:  Negative for abdominal pain.  Genitourinary:  Negative for dysuria.  Skin:  Negative for rash.  Neurological:  Positive for weakness, numbness and headaches. Negative for speech difficulty.    Physical Exam  Updated Vital Signs BP (!) 128/92   Pulse 83   Temp 98.2 F (36.8 C) (Oral)   Resp 17   Ht 5' 10"$  (1.778 m)   Wt 101.2 kg   SpO2 94%   BMI 32.00 kg/m  Physical Exam Vitals and nursing note reviewed.  Constitutional:      General: He is not in acute distress.    Appearance: Normal appearance. He is well-developed.  HENT:     Head: Normocephalic and atraumatic.  Eyes:     Conjunctiva/sclera: Conjunctivae normal.  Cardiovascular:     Rate and Rhythm: Normal rate and regular rhythm.     Heart sounds: No murmur heard. Pulmonary:      Effort: Pulmonary effort is normal. No respiratory distress.     Breath sounds: Normal breath sounds.  Abdominal:     Palpations: Abdomen is soft.     Tenderness: There is no abdominal tenderness. There is no guarding or rebound.  Musculoskeletal:        General: No swelling.     Cervical back: Neck supple.  Skin:    General: Skin is warm and dry.     Capillary Refill: Capillary refill takes less than 2 seconds.  Neurological:     Mental Status: He is alert.     Gait: Gait normal.     Comments: He has some numbness in the right side of his face right arm.  There are some subtle right hand weakness.     ED Results / Procedures / Treatments   Labs (all labs ordered are listed, but only abnormal results are displayed) Labs Reviewed  CBC - Abnormal; Notable for the following components:      Result Value   WBC 12.9 (*)    All other components within normal limits  DIFFERENTIAL - Abnormal; Notable for the following components:   Neutro Abs 8.4 (*)    All other components within normal limits  COMPREHENSIVE METABOLIC PANEL - Abnormal; Notable for the following components:   Glucose, Bld 139 (*)    All other components within normal limits  URINALYSIS, ROUTINE W REFLEX MICROSCOPIC - Abnormal; Notable for the following components:   Glucose, UA >=500 (*)    All other components within normal limits  I-STAT CHEM 8, ED - Abnormal; Notable for the following components:   Glucose, Bld 134 (*)    Calcium, Ion 1.13 (*)    All other components within normal limits  CBG MONITORING, ED - Abnormal; Notable for the following components:   Glucose-Capillary 133 (*)    All other components within normal limits  ETHANOL  PROTIME-INR  APTT  RAPID URINE DRUG SCREEN, HOSP PERFORMED  HIV ANTIBODY (ROUTINE TESTING W REFLEX)  HEMOGLOBIN A1C  LIPID PANEL    EKG EKG Interpretation  Date/Time:  Monday June 25 2022 07:20:18 EST Ventricular Rate:  81 PR Interval:  184 QRS  Duration: 101 QT Interval:  377 QTC Calculation: 438 R Axis:   19 Text Interpretation: Sinus rhythm No significant change since last tracing Confirmed by Aletta Edouard [phone]) on 06/25/2022 7:26:45 AM  Radiology CT HEAD CODE STROKE WO CONTRAST  Result Date: 06/25/2022 CLINICAL DATA:  Code stroke.  Right-sided numbness.  Weakness. EXAM: CT HEAD WITHOUT CONTRAST TECHNIQUE: Contiguous axial images were obtained from the base of the skull through the vertex without intravenous contrast. RADIATION DOSE REDUCTION: This exam was performed according to the departmental dose-optimization program which includes automated exposure control, adjustment of the mA and/or kV according  to patient size and/or use of iterative reconstruction technique. COMPARISON:  MRI Brain 10/01/19 FINDINGS: Brain: No evidence of acute infarction, hemorrhage, hydrocephalus, extra-axial collection or mass lesion/mass effect. Vascular: No disproportionately hyperdense vessel is visualized. Skull: Normal. Negative for fracture or focal lesion. Sinuses/Orbits: No mastoid or middle ear effusion. Paranasal sinuses are clear. Orbits are unremarkable. Other: None. ASPECTS [facility] Stroke Program Early CT Score): 10 IMPRESSION: No hemorrhage or CT evidence of an acute infarct. Findings were discussed with Dr. Ashok Cordia on 06/25/22 at 7:27 AM Electronically Signed   By: Marin Roberts M.D.   On: 06/25/2022 07:29    Procedures .Critical Care  Performed by: Hayden Rasmussen, MD Authorized by: Hayden Rasmussen, MD   Critical care provider statement:    Critical care time (minutes):  45   Critical care time was exclusive of:  Separately billable procedures and treating other patients   Critical care was necessary to treat or prevent imminent or life-threatening deterioration of the following conditions:  CNS failure or compromise   Critical care was time spent personally by me on the following activities:  Development of treatment plan with  patient or surrogate, discussions with consultants, evaluation of patient's response to treatment, examination of patient, obtaining history from patient or surrogate, ordering and performing treatments and interventions, ordering and review of laboratory studies, ordering and review of radiographic studies, pulse oximetry, re-evaluation of patient's condition and review of old charts   I assumed direction of critical care for this patient from another provider in my specialty: no       Medications Ordered in ED Medications   stroke: early stages of recovery book (has no administration in time range)  acetaminophen (TYLENOL) tablet 650 mg (650 mg Oral Given 06/25/22 1649)    Or  acetaminophen (TYLENOL) 160 MG/5ML solution 650 mg ( Per Tube See Alternative 06/25/22 1649)    Or  acetaminophen (TYLENOL) suppository 650 mg ( Rectal See Alternative 06/25/22 1649)  senna-docusate (Senokot-S) tablet 1 tablet (has no administration in time range)  pantoprazole (PROTONIX) injection 40 mg (has no administration in time range)  insulin aspart (novoLOG) injection 0-15 Units (has no administration in time range)  tenecteplase (TNKASE) injection for Stroke 25 mg (25 mg Intravenous Given 06/25/22 0759)  iohexol (OMNIPAQUE) 350 MG/ML injection 75 mL (75 mLs Intravenous Contrast Given 06/25/22 0900)    ED Course/ Medical Decision Making/ A&P Clinical Course as of 06/25/22 1735  Mon Jun 25, 2022  0715 After initial discussion with patient code stroke activated.  Time of onset of headache was 330 and time of onset of neurologic symptoms was 530 [MB]  0735 Received a call from radiology that the head CT did not show any acute findings. [MB]  [id number] Tele-neurology ultimately decided that TNK was indicated.  We had a phone discussion afterwards and he would like the patient to get a CTA.  I am reaching out to neurology in [place] for transfer.  She was updated on plan. [MB]  0845 Discussed with Dr. Curly Shores neurology  who accepts the patient in transfer to ICU at [facility] [MB]    Clinical Course User Index [MB] Hayden Rasmussen, MD                             Medical Decision Making Amount and/or Complexity of Data Reviewed Labs: ordered. Radiology: ordered.  Risk Prescription drug management. Decision regarding hospitalization.   This patient complains of right-sided  numbness possible right weakness; this involves an extensive number of treatment Options and is a complaint that carries with it a high risk of complications and morbidity. The differential includes stroke, bleed, hypertensive emergency, TIA, peripheral nerve, metabolic derangement  I ordered, reviewed and interpreted labs, which included CBC with elevated white count normal hemoglobin, chemistries elevated glucose, INR normal I ordered imaging studies which included CT head, CT angio head and neck and I independently    visualized and interpreted imaging which showed no acute findings Additional history obtained from patient's wife Previous records obtained and reviewed in epic including prior workup last year for similar presentation I consulted teleneurology and Dr. Curly Shores neurology and discussed lab and imaging findings and discussed disposition.  Cardiac monitoring reviewed, normal sinus rhythm Social determinants considered, ongoing tobacco use Critical Interventions: Rapid evaluation and management of patient's acute strokelike symptoms with IV TNK  After the interventions stated above, I reevaluated the patient and found patient to be stable if not somewhat improved Admission and further testing considered, he will require transfer to [facility] for further ICU admission and management of his strokelike symptoms         Final Clinical Impression(s) / ED Diagnoses Final diagnoses:  Acute ischemic stroke [facility])    Rx / DC Orders ED Discharge Orders     None         Hayden Rasmussen, MD 06/25/22 1739

## 2022-06-25 NOTE — ED Notes (Signed)
0809 Dr Dale Flovilla off cart.  Discussed with ED nurse post TNK bp and neuro check parameters with RN acknowledging understanding.

## 2022-06-25 NOTE — ED Notes (Signed)
EDP at bedside assessing pt.

## 2022-06-25 NOTE — ED Notes (Signed)
Gave 10cc NS TNK 0759 10cc NS 0759

## 2022-06-25 NOTE — ED Triage Notes (Signed)
Pt woke up at 330am with a headache and right sided weakness , numbness in face arms, and legs since 530am. Pt went to bed at 11pm . Lkw =11pm. Pt ambulated to ED.

## 2022-06-25 NOTE — H&P (Signed)
Neurology H&P CC: right sided weakness/numbness and headache  History is obtained from: Patient and chart review   HPI: Shaun Williams is a 52 y.o. male with past medical history significant for hypertension, hyperlipidemia, diabetes, tobacco abuse, obesity, alcohol use, possible TIA versus complex headache  Based on chart review with key details to be confirmed with the patient and wife on arrival, he went to bed 11 PM at his baseline and woke up at 3:30 AM with a 6-7 out of 10 headache in the posterior parietal/occipital region (dull pain without photophobia or nausea).  On arrival to work at [employer] he had acute right-sided facial numbness and then noted some right hand weakness and trouble writing with gradual progression into his right leg, right eye blurry vision and possibly dragging his right leg with ambulation, though with slight improvement of his headache to 5-6/10 for which he took Tylenol.   Notably he did present in 2014 with right-sided facial numbness, drooling, inability to close his right eye initially thought to be Bell's palsy for which he was given Solu-Medrol but then also was having right arm and hand weakness subjectively not appreciated on ED provider's examination.  Additionally he presented Oct 01, 2019 with a right face/arm/leg numbness and weakness in the setting of headache, presenting out of the window for TNK with symptoms resolving.  He did have full stroke workup at that time notable for normal echocardiogram, normal MRI brain, MRA head and neck, A1c 7.5%, LDL 83.  He was discharged on baby aspirin and low-dose atorvastatin but was not aware that he needed to be taking these long-term. He notes his right hand continued to have loss of sensation in the thumb, pointer finger, middle finger and ring finger since that time, never recovering fully  He was evaluated by video neurology at [facility] and TNK was administered at 759, metrics copied as  below  "[facility] Notification Time: 06/25/2022 [id number]  Stamp Time: 06/25/2022 [id number] Initial Response Time: 06/25/2022 0729 Arrival Time/Door Time: 06/25/2022 0647 Initial Patient Interaction Time: 06/25/2022 0731 NIHSS Assessment Completed Time: 06/25/2022 0743 Thrombolytic Medical Decision Time: 06/25/2022 0752  There was a delay in IV tenecteplase administration due to activation delay, not obtaining the patient's accurate weight upfront, and medical decision making from the patient and his wife"  LKW: 06/25/2022 0500  Thrombolytic given?: 82 at [place] of contraindications was reviewed and negative. Risks, benefits and alternatives were discussed by [facility]  IA performed?: Not a candidate as his overall symptoms are not consistent with a large vessel proximal occlusion, and NIH stroke scale score is currently less than 6. Premorbid modified rankin scale:      0 - No symptoms.  ROS: All other review of systems was negative except as noted in the HPI.   Past Medical History:  Diagnosis Date   CVA (cerebral vascular accident) ([place]) 2021   Diabetes mellitus without complication ([place])    Hypertension    Past Surgical History:  Procedure Laterality Date   KNEE SURGERY     SHOULDER SURGERY     Current Outpatient Medications  Medication Instructions   ALPRAZolam (XANAX) 0.25-0.5 mg, Oral, Daily PRN   amLODipine (NORVASC) 5 MG tablet TAKE ONE TABLET BY MOUTH EVERY DAY   aspirin EC 81 mg, Oral, Daily   atorvastatin (LIPITOR) 10 mg, Oral, Every evening   dicyclomine (BENTYL) 20 mg, Oral, 2 times daily PRN   Jardiance 25 mg, Oral, Daily   lisinopril-hydrochlorothiazide (PRINZIDE,ZESTORETIC) 20-25  MG per tablet TAKE ONE TABLET BY MOUTH ONCE EVERY DAY   No family history on file.   Social History:  reports that he has been smoking cigarettes. He has never used smokeless tobacco. He reports that he does not currently use alcohol. He reports that he does not use  drugs.   Exam: Current vital signs: BP 134/85 (BP Location: Left Arm)   Pulse 80   Temp 98.2 F (36.8 C) (Oral)   Resp 17   Ht 5' 10"$  (1.778 m)   Wt 101.2 kg   SpO2 97%   BMI 32.00 kg/m  Vital signs in last 24 hours: Temp:  [98.1 F (36.7 C)-98.2 F (36.8 C)] 98.2 F (36.8 C) (02/19 0830) Pulse Rate:  [74-102] 80 (02/19 0830) Resp:  [13-31] 17 (02/19 0830) BP: (122-144)/(81-93) 134/85 (02/19 0830) SpO2:  [97 %-99 %] 97 % (02/19 0830) Weight:  [100.7 kg-101.2 kg] 101.2 kg (02/19 0751)   Physical Exam  Constitutional: Appears well-developed and well-nourished.  Psych: Affect appropriate to situation, pleasant, cooperative  Eyes: No scleral injection HENT: No oropharyngeal obstruction.  MSK: no joint deformities.  Cardiovascular: Normal rate and regular rhythm. Perfusing extremities well Respiratory: Effort normal, non-labored breathing GI: Soft.  No distension. There is no tenderness.  Skin: Warm dry and intact visible skin  Neuro: Mental Status: Patient is awake, alert, oriented to person, place, month, year, and situation. Patient is able to give a clear and coherent history. No signs of aphasia or neglect Cranial Nerves: II: Visual Fields are full. Pupils are equal, round, and reactive to light.   III,IV, VI: EOMI without ptosis, slight diplopia on right gaze V: Facial sensation is symmetric to temperature VII: Facial movement is notable for slight right facial droop VIII: hearing is reduced on the right on tuning fork testing X: Uvula elevates symmetrically XI: Shoulder shrug is symmetric. XII: tongue is midline without atrophy or fasciculations.  Motor: Tone is normal. Bulk is normal. 5/5 strength was present in all four extremities, other than 4+/5 (give-way?) in the right hip flexion  Sensory: Sensation is reduced on the RUE, no length dependent loss of temperature sensation Cerebellar: FNF and HKS are intact bilaterally Gait:  Deferred due to recent  TNK  NIHSS total 3 Score breakdown: 1 for right facial droop, 1 for right leg drift, 1 for sensory change in the right hand Performed at 4:30 PM, see telespecialist note for prior to TNK exam  I have reviewed labs in epic and the results pertinent to this consultation are:  Basic Metabolic Panel: Recent Labs  Lab 06/25/22 0709 06/25/22 0714  NA 139 140  K 3.6 3.5  CL 101 102  CO2 26  --   GLUCOSE 139* 134*  BUN 14 13  CREATININE 0.91 0.90  CALCIUM 9.1  --     CBC: Recent Labs  Lab 06/25/22 0709 06/25/22 0714  WBC 12.9*  --   NEUTROABS 8.4*  --   HGB 15.4 16.3  HCT 45.8 48.0  MCV 94.2  --   PLT 230  --     Coagulation Studies: Recent Labs    06/25/22 0709  LABPROT 13.2  INR 1.0      I have reviewed the images obtained:  Head CT no acute intracranial process   CTA no LVO on my read, radiology read pending   Impression: Stroke-like episode though stereotyped symptoms with associated headache and gradual spread from arm to face then leg somewhat atypical.   Recommendations: #  Stroke vs. Complex headache - Stroke labs HgbA1c, fasting lipid panel - MRI brain 24 hours post TNK - MRI c-spine to assess for any cervical pathology contributing given neck pain - Frequent neuro checks - Echocardiogram - Hold for 24 hours until post TNK head imaging completed  - Risk factor modification - Telemetry monitoring - Blood pressure goal   - Post TNK for 24  hours < 180/105 - PT consult, OT consult, Speech consult, unless patient is back to baseline - Stroke team admission  # DM - Moderate dose SSI - A1c goal < 7%  # HTN - Hold home lisinopril-hctz 20-25 BID for now   Lesleigh Noe MD-PhD [facility] [phone] Lesleigh Noe MD-PhD [facility] [phone]    Total critical care time: 40 minutes   Critical care time was exclusive of separately billable procedures and treating other patients.   Critical care was necessary  to treat or prevent imminent or life-threatening deterioration.   Critical care was time spent personally by me on the following activities: development of treatment plan with patient and/or surrogate as well as nursing, discussions with consultants/primary team, evaluation of patient's response to treatment, examination of patient, obtaining history from patient or surrogate, ordering and performing treatments and interventions, ordering and review of laboratory studies, ordering and review of radiographic studies, and re-evaluation of patient's condition as needed, as documented above.

## 2022-06-25 NOTE — ED Notes (Signed)
30 Dr Dale Sheridan discussing TNK [id number] TNK order placed 0759 TNK given.

## 2022-06-25 NOTE — Progress Notes (Signed)
Pt arrived to 4N18 via [facility]. Bedside report obtained. Pt c/o numbness/tingling in his R side, a headache, and slight vision changes in right visual field that have slowly been improving. Wife at bedside. Call bell and bedside table within reach. SRN at bedside notifying MD of admission.   Rufina Falco, RN BSN 06/25/2022 2:51 PM

## 2022-06-25 NOTE — ED Notes (Signed)
0830 Off cart.  Ed nurse aware of stat cta need and neuro/vital parameters.  Dicussed with wife and pt signs/symptoms to report to RN.

## 2022-06-26 ENCOUNTER — Inpatient Hospital Stay (HOSPITAL_COMMUNITY): Payer: BC Managed Care – PPO

## 2022-06-26 DIAGNOSIS — R2990 Unspecified symptoms and signs involving the nervous system: Secondary | ICD-10-CM | POA: Diagnosis not present

## 2022-06-26 DIAGNOSIS — G459 Transient cerebral ischemic attack, unspecified: Secondary | ICD-10-CM

## 2022-06-26 LAB — BASIC METABOLIC PANEL
Anion gap: 11 (ref 5–15)
BUN: 14 mg/dL (ref 6–20)
CO2: 26 mmol/L (ref 22–32)
Calcium: 9 mg/dL (ref 8.9–10.3)
Chloride: 98 mmol/L (ref 98–111)
Creatinine, Ser: 0.84 mg/dL (ref 0.61–1.24)
GFR, Estimated: >60 mL/min (ref >60)
Glucose, Bld: 118 mg/dL — ABNORMAL HIGH (ref 70–99)
Potassium: 2.9 mmol/L — ABNORMAL LOW (ref 3.5–5.1)
Sodium: 135 mmol/L (ref 135–145)

## 2022-06-26 LAB — GLUCOSE, CAPILLARY
Glucose-Capillary: 147 mg/dL — ABNORMAL HIGH (ref 70–99)
Glucose-Capillary: 186 mg/dL — ABNORMAL HIGH (ref 70–99)
Glucose-Capillary: 168 mg/dL — ABNORMAL HIGH (ref 70–99)

## 2022-06-26 LAB — LIPID PANEL
Cholesterol: 176 mg/dL (ref 0–200)
HDL: 25 mg/dL — ABNORMAL LOW (ref >40)
LDL Cholesterol: 122 mg/dL — ABNORMAL HIGH (ref 0–99)
Total CHOL/HDL Ratio: 7 RATIO
Triglycerides: 143 mg/dL (ref <150)
VLDL: 29 mg/dL (ref 0–40)

## 2022-06-26 LAB — CBC
HCT: 44.5 % (ref 39.0–52.0)
Hemoglobin: 15.5 g/dL (ref 13.0–17.0)
MCH: 32.2 pg (ref 26.0–34.0)
MCHC: 34.8 g/dL (ref 30.0–36.0)
MCV: 92.5 fL (ref 80.0–100.0)
Platelets: 204 10*3/uL (ref 150–400)
RBC: 4.81 MIL/uL (ref 4.22–5.81)
RDW: 13.4 % (ref 11.5–15.5)
WBC: 11.8 10*3/uL — ABNORMAL HIGH (ref 4.0–10.5)
nRBC: 0 % (ref 0.0–0.2)

## 2022-06-26 LAB — ECHOCARDIOGRAM COMPLETE
Area-P 1/2: 2.79 cm2
Calc EF: 60.7 %
Height: 70 in
MV VTI: 2.84 cm2
S' Lateral: 4.3 cm
Single Plane A2C EF: 54.2 %
Single Plane A4C EF: 63.2 %
Weight: 3568 oz

## 2022-06-26 LAB — HIV ANTIBODY (ROUTINE TESTING W REFLEX): HIV Screen 4th Generation wRfx: NONREACTIVE

## 2022-06-26 LAB — HEMOGLOBIN A1C
Hgb A1c MFr Bld: 6.1 % — ABNORMAL HIGH (ref 4.8–5.6)
Mean Plasma Glucose: 128.37 mg/dL

## 2022-06-26 MED ORDER — ROSUVASTATIN CALCIUM 20 MG PO TABS: 20.0000 mg | ORAL_TABLET | Freq: Every day | ORAL | 3 refills | Status: AC

## 2022-06-26 MED ORDER — POTASSIUM CHLORIDE CRYS ER 20 MEQ PO TBCR
40.0000 meq | EXTENDED_RELEASE_TABLET | ORAL | Status: AC
  Administered 2022-06-26 (×2): 40 meq via ORAL
  Filled 2022-06-26 (×2): qty 2

## 2022-06-26 MED ORDER — ROSUVASTATIN CALCIUM 20 MG PO TABS
20.0000 mg | ORAL_TABLET | Freq: Every day | ORAL | Status: DC
  Administered 2022-06-26: 20 mg via ORAL
  Filled 2022-06-26: qty 1

## 2022-06-26 MED ORDER — CLOPIDOGREL BISULFATE 75 MG PO TABS: 75.0000 mg | ORAL_TABLET | Freq: Every day | ORAL | 0 refills | Status: DC

## 2022-06-26 MED ORDER — ASPIRIN 81 MG PO TBEC
81.0000 mg | DELAYED_RELEASE_TABLET | Freq: Every day | ORAL | Status: DC
  Administered 2022-06-26: 81 mg via ORAL
  Filled 2022-06-26: qty 1

## 2022-06-26 MED ORDER — ASPIRIN 81 MG PO TBEC: 81.0000 mg | DELAYED_RELEASE_TABLET | Freq: Every day | ORAL | 12 refills | Status: AC

## 2022-06-26 MED ORDER — CLOPIDOGREL BISULFATE 75 MG PO TABS
75.0000 mg | ORAL_TABLET | Freq: Every day | ORAL | Status: DC
  Administered 2022-06-26: 75 mg via ORAL
  Filled 2022-06-26: qty 1

## 2022-06-26 MED ORDER — CLOPIDOGREL BISULFATE 75 MG PO TABS: 75.0000 mg | ORAL_TABLET | Freq: Every day | ORAL | Status: DC

## 2022-06-26 MED ORDER — PANTOPRAZOLE SODIUM 40 MG PO TBEC: 40.0000 mg | DELAYED_RELEASE_TABLET | Freq: Every day | ORAL | Status: DC

## 2022-06-26 NOTE — Evaluation (Signed)
Speech Language Pathology Evaluation Patient Details Name: Shaun Williams MRN: [id number] DOB: November 06, 1970 Today's Date: 06/26/2022 Time: [id number] SLP Time Calculation (min) (ACUTE ONLY): 12 min  Problem List:  Patient Active Problem List   Diagnosis Date Noted   Stroke-like symptom 06/25/2022   TIA (transient ischemic attack) 10/02/2019   Type 2 diabetes mellitus without complication ([place]) [id number]   Essential hypertension 10/01/2019   Focal neurological deficit 10/01/2019   Past Medical History:  Past Medical History:  Diagnosis Date   CVA (cerebral vascular accident) ([place]) 2021   Diabetes mellitus without complication ([place])    Hypertension    Past Surgical History:  Past Surgical History:  Procedure Laterality Date   KNEE SURGERY     SHOULDER SURGERY     HPI:  Pt is a 52 yo male presenting to ED with a headache, R sided facial numbess, and R hand weakness, with gradual progression into his R leg and R eye blurry vision. MRI negative for acute findings. PMH includes HTN, hyperlipidemia, diabetes, tobacco abuse, obesity, alcohol use.   Assessment / Plan / Recommendation Clinical Impression  Pt states that he handles all finances and medications independently and he and his wife have no acute concerns for cognition. He scored a 27/30 on the SLUMS which is [facility] with errors characterized by minimal memory difficulties. Suspect pt is performing at his baseline level, so no SLP f/u recommended at this time. Pt agrees that he does not feel f/u is necessary.    SLP Assessment  SLP Recommendation/Assessment: Patient does not need any further Speech [facility] SLP Visit Diagnosis: Cognitive communication deficit (R41.841)    Recommendations for follow up therapy are one component of a multi-disciplinary discharge planning process, led by the attending physician.  Recommendations may be updated based on patient status, additional functional criteria and insurance  authorization.    Follow Up Recommendations  No SLP follow up    Assistance Recommended at Discharge  None  Functional Status Assessment Patient has not had a recent decline in their functional status  Frequency and Duration           SLP Evaluation Cognition  Overall Cognitive Status: Within Functional Limits for tasks assessed Orientation Level: Oriented X4       Comprehension  Auditory Comprehension Overall Auditory Comprehension: Appears within functional limits for tasks assessed Visual Recognition/Discrimination Discrimination: Within Function Limits Reading Comprehension Reading Status: Not tested    Expression Expression Primary Mode of Expression: Verbal Verbal Expression Overall Verbal Expression: Appears within functional limits for tasks assessed Written Expression Dominant Hand: Right   Oral / Motor  Motor Speech Overall Motor Speech: Appears within functional limits for tasks assessed            Fabio Asa., Student SLP  06/26/2022, 10:47 AM

## 2022-06-26 NOTE — Discharge Summary (Addendum)
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   amLODipine 5 MG tablet Commonly known as: NORVASC TAKE ONE TABLET BY MOUTH EVERY DAY   aspirin EC 81 MG tablet Take 1 tablet (81 mg total) by mouth daily.   dicyclomine 20 MG tablet Commonly known as: BENTYL Take 1 tablet (20 mg total) by mouth 2 (two) times daily as needed for spasms.   Jardiance 25 MG Tabs tablet Generic drug: empagliflozin Take 1 tablet (25 mg total) by mouth daily.   lisinopril-hydrochlorothiazide 20-25 MG tablet Commonly known as: ZESTORETIC TAKE ONE TABLET BY MOUTH ONCE EVERY DAY What changed:  how much to take how to take this when to take this additional instructions       LABORATORY STUDIES CBC    Component Value Date/Time   WBC 11.8 (H) 06/26/2022 0344   RBC 4.81 06/26/2022 0344   HGB 15.5 06/26/2022 0344   HCT 44.5 06/26/2022 0344   PLT 204 06/26/2022 0344   MCV 92.5 06/26/2022 0344   MCH 32.2 06/26/2022 0344   MCHC 34.8 06/26/2022 0344   RDW 13.4 06/26/2022 0344   LYMPHSABS 3.3 06/25/2022 0709   MONOABS 1.0 06/25/2022 0709   EOSABS 0.2 06/25/2022 0709   BASOSABS 0.1 06/25/2022 0709   CMP    Component Value Date/Time   NA 135 06/26/2022 0344   K 2.9 (L)  06/26/2022 0344   CL 98 06/26/2022 0344   CO2 26 06/26/2022 0344   GLUCOSE 118 (H) 06/26/2022 0344   BUN 14 06/26/2022 0344   CREATININE 0.84 06/26/2022 0344   CALCIUM 9.0 06/26/2022 0344   PROT 7.7 06/25/2022 0709   ALBUMIN 4.3 06/25/2022 0709   AST 17 06/25/2022 0709   ALT 26 06/25/2022 0709   ALKPHOS 49 06/25/2022 0709   BILITOT 0.6 06/25/2022 0709   GFRNONAA >60 06/26/2022 0344   GFRAA >60 10/01/2019 1908   COAGS Lab Results  Component Value Date   INR 1.0 06/25/2022   INR 1.0 10/01/2019   Lipid Panel    Component Value Date/Time   CHOL 176 06/26/2022 0344   TRIG 143 06/26/2022 0344   HDL 25 (L) 06/26/2022 0344   CHOLHDL 7.0 06/26/2022 0344   VLDL 29 06/26/2022 0344   LDLCALC 122 (H) 06/26/2022 0344   HgbA1C  Lab Results  Component Value Date   HGBA1C 6.1 (H) 06/26/2022   Urinalysis    Component Value Date/Time   COLORURINE YELLOW 06/25/2022 0836   APPEARANCEUR CLEAR 06/25/2022 0836   LABSPEC 1.011 06/25/2022 0836   PHURINE 5.0 06/25/2022 0836   GLUCOSEU >=500 (A) 06/25/2022 0836   HGBUR NEGATIVE 06/25/2022 0836   BILIRUBINUR NEGATIVE 06/25/2022  [place] 06/25/2022 0836   PROTEINUR NEGATIVE 06/25/2022 0836   NITRITE NEGATIVE 06/25/2022 0836   LEUKOCYTESUR NEGATIVE 06/25/2022 0836   Urine Drug Screen     Component Value Date/Time   LABOPIA NONE DETECTED 06/25/2022 0836   COCAINSCRNUR NONE DETECTED 06/25/2022 0836   LABBENZ NONE DETECTED 06/25/2022 0836   AMPHETMU NONE DETECTED 06/25/2022 0836   THCU NONE DETECTED 06/25/2022 0836   LABBARB NONE DETECTED 06/25/2022 0836    Alcohol Level    Component Value Date/Time   ETH <10 06/25/2022 [id number]   SIGNIFICANT DIAGNOSTIC STUDIES MR BRAIN WO CONTRAST  Result Date: 06/26/2022 CLINICAL DATA:  52 year old male code stroke presentation yesterday with right side deficits. EXAM: MRI HEAD WITHOUT CONTRAST TECHNIQUE: Multiplanar, multiecho pulse sequences of the brain and surrounding structures  were obtained without intravenous contrast. COMPARISON:  Head CT, CTA head and neck yesterday. Brain MRI 10/01/2019. FINDINGS: Brain: Cerebral volume is not significantly changed since 2021. No restricted diffusion to suggest acute infarction. No midline shift, mass effect, evidence of mass lesion, ventriculomegaly, extra-axial collection or acute intracranial hemorrhage. Cervicomedullary junction and pituitary are within normal limits. Mild for age nonspecific, mostly anterior frontal lobe subcortical white matter T2 and FLAIR hyperintensity does not appear significantly changed since 2021. No cortical encephalomalacia or chronic cerebral blood products identified. The deep gray nuclei, brainstem, and cerebellum appear negative. Vascular: Major intracranial vascular flow voids are stable since 2021. Skull and upper cervical spine: Negative visible cervical spine. Visualized bone marrow signal is within normal limits. Sinuses/Orbits: Stable, negative. Other: Mastoids are clear. Visible internal auditory structures appear normal. Trace retained secretions in the left pharynx. Otherwise negative visible scalp and face. IMPRESSION: 1. No acute intracranial abnormality. 2. Stable since 2021 and largely unremarkable for age noncontrast MRI appearance of the brain. Electronically Signed   By: Genevie Ann M.D.   On: 06/26/2022 06:56   CT ANGIO HEAD NECK W WO CM (CODE STROKE)  Result Date: 06/25/2022 CLINICAL DATA:  Suspected left MCA stroke EXAM: CT ANGIOGRAPHY HEAD AND NECK TECHNIQUE: Multidetector CT imaging of the head and neck was performed using the standard protocol during bolus administration of intravenous contrast. Multiplanar CT image reconstructions and MIPs were obtained to evaluate the vascular anatomy. Carotid stenosis measurements (when applicable) are obtained utilizing NASCET criteria, using the distal internal carotid diameter as the denominator. RADIATION DOSE REDUCTION: This exam was performed according  to the departmental dose-optimization program which includes automated exposure control, adjustment of the mA and/or kV according to patient size and/or use of iterative reconstruction technique. CONTRAST:  32m OMNIPAQUE IOHEXOL 350 MG/ML SOLN COMPARISON:  10/01/2019 MRA FINDINGS: CTA NECK FINDINGS Aortic arch: Atheromatous plaque. Right carotid system: Moderate calcified plaque at the bifurcation. No flow limiting stenosis, ulceration, or dissection. Left carotid system: Mild to moderate calcified plaque at the bifurcation. No stenosis, ulceration, or dissection. Vertebral arteries: No proximal subclavian stenosis. Codominant vertebral arteries that are smoothly contoured and widely patent to the dura. Skeleton: Multilevel degenerative spurring in the cervical spine. Other neck: No acute or aggressive finding Upper chest: Negative. Review of the MIP images confirms the above findings CTA HEAD FINDINGS Anterior circulation: Atheromatous calcification along the carotid siphons. No branch occlusion, beading, or aneurysm. There is presumed atheromatous irregularity of MCA and ACA branches. No proximal flow limiting stenosis. Posterior circulation: Vertebral and basilar arteries are smoothly contoured and widely patent. No branch occlusion, beading, or aneurysm. There is atheromatous type irregularity of the posterior cerebral arteries without  high-grade proximal stenosis. Venous sinuses: Negative Review of the MIP images confirms the above findings IMPRESSION: 1. No emergent finding. 2. Premature and generalized atherosclerosis but no flow limiting stenosis or ulceration of major arteries in the head and neck. Electronically Signed   By: Margaretha Sheffield M.D.   On: 06/25/2022 18:08   CT HEAD CODE STROKE WO CONTRAST  Result Date: 06/25/2022 CLINICAL DATA:  Code stroke.  Right-sided numbness.  Weakness. EXAM: CT HEAD WITHOUT CONTRAST TECHNIQUE: Contiguous axial images were obtained from the base of the skull  through the vertex without intravenous contrast. RADIATION DOSE REDUCTION: This exam was performed according to the departmental dose-optimization program which includes automated exposure control, adjustment of the mA and/or kV according to patient size and/or use of iterative reconstruction technique. COMPARISON:  MRI Brain 10/01/19 FINDINGS: Brain: No evidence of acute infarction, hemorrhage, hydrocephalus, extra-axial collection or mass lesion/mass effect. Vascular: No disproportionately hyperdense vessel is visualized. Skull: Normal. Negative for fracture or focal lesion. Sinuses/Orbits: No mastoid or middle ear effusion. Paranasal sinuses are clear. Orbits are unremarkable. Other: None. ASPECTS [facility] Stroke Program Early CT Score): 10 IMPRESSION: No hemorrhage or CT evidence of an acute infarct. Findings were discussed with Dr. Ashok Cordia on 06/25/22 at 7:27 AM Electronically Signed   By: Marin Roberts M.D.   On: 06/25/2022 07:29      HISTORY OF PRESENT ILLNESS Shaun Williams is a 52 y.o. male with past medical history significant for hypertension, hyperlipidemia, diabetes, tobacco abuse, obesity, alcohol use, who presented 2/19 for evaluation of possible TIA versus complex migraine headache. Based on chart review, he went to bed 11 PM at his baseline and woke up at 3:30 AM with a 7/10 headache in the posterior parietal/occipital region (dull pain without photophobia or nausea).  On arrival to work at [employer] he had acute right-sided facial numbness and then noted some right hand weakness and trouble writing with gradual progression into his right leg, right eye blurry vision and possibly dragging his right leg with ambulation, though with slight improvement of his headache to 5-6/10 for which he took Tylenol. He presented to [facility] ED and was evaluated by video neurology with TNK administered at 0759.   Notably he did present in 2014 with right-sided facial numbness, drooling, inability to close  his right eye initially thought to be Bell's palsy for which he was given Solu-Medrol but then also was having right arm and hand weakness subjectively not appreciated on ED provider's examination.   Additionally he presented Oct 01, 2019 with a right face/arm/leg numbness and weakness in the setting of headache, presenting out of the window for TNK with symptoms resolving.  He did have full stroke workup at that time notable for normal echocardiogram, normal MRI brain, MRA head and neck, A1c 7.5%, LDL 83.  He was discharged on baby aspirin and low-dose atorvastatin but was not aware that he needed to be taking these long-term. 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 ASPECTS 10.    CTA head & neck No emergent finding, premature and generalized atherosclerosis but no flow limiting stnosis or ulceration of major arteries in the head and neck.  MRI  No  acute intracranial abnormality 2D Echo LVEF 60-65%, no atrial level shunt detected LDL 122 HgbA1c 6.1 VTE prophylaxis - None, patient is ambulatory aspirin 81 mg daily prior to admission, now on aspirin 81 mg daily and clopidogrel 75 mg daily for 21 days followed by ASA monotherapy.  Follow up outpatient with Dr. Jaynee Eagles for further evaluation and management of possible hemiplegic migraine headaches within 4 weeks.   Therapy recommendations:  No follow up recommended Disposition:  Home   ? Hemiplegic migraine  2014 pt had episode of right facial droop and right sided weakness. He could not remember whether he had HA or not. Work up negative, was told to have "bell's palsy" 09/2019 HA and right sided weakness. Stroke work up all negative.  Discharged with ASA and lipitor This time again, HA and right facial droop, numbness and right sided weakness with right blurry vision. MRI again negative. He still has slight right nasolabial fold flattening on my exam, and slight right leg weakness on walking with PT.  Concerning for hemiplegic migraine but denies any family history or frequent HA at home. Will send to Dr Jaynee Eagles for second opinion.   Hypertension Home meds:  Norvasc 5 mg, lisinopril-HCTZ Stable Long-term BP goal normotensive   Hyperlipidemia Home meds:  atorvastatin 10 mg patient reported not taking LDL 122, goal < 70 Change to Crestor 20 mg at discharge Continue statin at discharge  Diabetes type II Controlled Home meds:  Jardiance HgbA1c 6.1%, goal < 7.0 CBGs SSI inpatient, continue home medications at discharge  Tobacco abuse Current smoker Smoking cessation counseling provided Pt is willing to quit   Other Stroke Risk Factors Obesity, Body mass index is 32 kg/m., BMI >/= 30 associated with increased stroke risk, recommend weight loss, diet and exercise as appropriate    Other Active Problems    DISCHARGE EXAM Blood pressure 117/71, pulse 72, temperature 98.2 F (36.8 C), temperature source Oral, resp. rate (!) 9, height 5' 10"$  (1.778 m), weight 101.2 kg, SpO2 94 %.  Physical Exam  Constitutional: Appears well-developed and well-nourished, in no acute distress Psych: Affect appropriate to situation, pleasant, cooperative  Eyes: No scleral injection HENT: No oropharyngeal obstruction.  MSK: no joint deformities.  Cardiovascular: Normal rate and regular rhythm. Perfusing extremities well Respiratory: Effort normal, non-labored breathing GI: Soft.  No distension. There is no tenderness.  Skin: Warm dry and intact visible skin  Neuro: Mental Status: Patient is awake, alert, oriented to person, place, month, year, and situation. Patient is able to give a clear and coherent history. No signs of  aphasia or neglect, no dysarthria.  Cranial Nerves: II: Visual Fields are full. Pupils are equal, round, and reactive to light.   III,IV, VI: EOMI without ptosis, slight diplopia on right gaze V: Facial sensation is symmetric to light touch VII: Minimal right facial droop at rest per patient's wife, smile elevates symmetrically VIII: Hearing is intact to voice X: Palate elevates symmetrically XI: Shoulder shrug is symmetric. XII: Tongue is midline without atrophy or fasciculations.  Motor: Tone is normal. Bulk is normal. 5/5 strength was present in all four extremities Sensory: Sensation is reduced on the RUE and hand, though improved since yesterday Cerebellar: FNF and HKS are intact bilaterally Gait:  Stable gait on ambulation   Discharge Diet       Diet   Diet heart healthy/carb modified Room service appropriate? Yes; Fluid consistency: Thin   liquids  DISCHARGE PLAN Disposition:  Home aspirin 81 mg daily and clopidogrel 75  mg daily for secondary stroke prevention for 3 weeks then aspirin alone. Ongoing stroke risk factor control by Primary Care Physician at time of discharge Follow-up PCP Denny Levy, [place] in 2 weeks. Follow-up in [facility] within 4 weeks with Dr. Jaynee Eagles, office to schedule an appointment.   35 minutes were spent preparing discharge.  Anibal Henderson, AGACNP-BC [facility] [phone]  ATTENDING NOTE: I reviewed above note and agree with the assessment and plan. Pt was seen and examined.   Wife at the bedside. Pt walked with PT in the hallway, and in room, with slight right leg weakness but no need rehab per PT. Pt also has slight right nasolabial fold flattening but otherwise neuro intact. MRI CT and CTA head and neck all negative. Pt dose have stroke risk factor such as HTN, HLD, smoker but BP is good in hospital. He has hx of similar episodes x 2 with both negative stroke work up. Wondering whether  hemiplegic migraine although no clear family hx. Can not completely rule out DWI negative infarct but it would be very rare in anterior circulation strokes. Will send him to follow up with Dr. Jaynee Eagles. Given benefit of doubt, will put on DAPT for 3 weeks and then ASA alone. Started crestor. Follow up with PCP and neurology.   For detailed assessment and plan, please refer to above/below as I have made changes wherever appropriate.   Rosalin Hawking, MD PhD Stroke Neurology 06/26/2022 3:51 PM

## 2022-06-26 NOTE — Progress Notes (Signed)
  Transition of Care [facility]) Screening Note   Patient Details  Name: Shaun Williams Date of Birth: 21-Aug-1970   Transition of Care [facility]) CM/SW Contact:    Geralynn Ochs, LCSW Phone Number: 06/26/2022, 3:06 PM    Transition of Care Department [facility]) has reviewed patient and no TOC needs have been identified at this time. We will continue to monitor patient advancement through interdisciplinary progression rounds. If new patient transition needs arise, please place a TOC consult.

## 2022-06-26 NOTE — Progress Notes (Signed)
  Echocardiogram 2D Echocardiogram has been performed.  Shaun Williams 06/26/2022, 1:59 PM

## 2022-06-26 NOTE — Evaluation (Signed)
Occupational Therapy Evaluation Patient Details Name: Shaun Williams MRN: [id number] DOB: Feb 02, 1971 Today's Date: 06/26/2022   History of Present Illness 52 y.o. male with past medical history significant for hypertension, hyperlipidemia, diabetes, tobacco abuse, obesity, alcohol use, possible TIA versus complex headache. Yesterday, pt experienced a severe headache followed by right upper/lower extremity weakness and right side facial droop. Today MRI is negative. Dr. 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   Clinical Impression   Patient evaluated by Occupational Therapy with no further acute OT needs identified. All education has been completed and the patient has no further questions. 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 Pt demonstrates slight weakness on the RUE. I do not recommend any follow up OT services at this time although I did encourage pt to let his MD know if the weakness does not resolve so a referral can be made for outpatient OT. Patient and wife verbalized understanding. See below for any follow-up Occupational Therapy or equipment needs. OT is signing off. Thank you for this referral.       Recommendations for follow up therapy are one component of a multi-disciplinary discharge planning process, led by the attending physician.  Recommendations may be updated based on patient status, additional functional criteria and insurance authorization.   Follow Up Recommendations  No OT follow up     Assistance Recommended at Discharge PRN     Functional Status Assessment  Patient has had a recent decline in their functional status and demonstrates the ability to make significant improvements in function in a reasonable and predictable amount of time.  Equipment Recommendations  None recommended by OT       Precautions / Restrictions  Precautions Precautions: Fall Restrictions Weight Bearing Restrictions: No      Mobility Bed Mobility Overal bed mobility: Modified Independent     Patient Response: Cooperative  Transfers Overall transfer level: Needs assistance Equipment used: None Transfers: Sit to/from Stand, Bed to chair/wheelchair/BSC Sit to Stand: Min guard     Step pivot transfers: Min guard      Balance Overall balance assessment: No apparent balance deficits (not formally assessed)       ADL either performed or assessed with clinical judgement   ADL Overall ADL's : At baseline;Modified independent         Vision Baseline Vision/History: 0 No visual deficits Ability to See in Adequate Light: 0 Adequate Patient Visual Report: Blurring of vision Vision Assessment?: Yes Ocular Range of Motion: Within Functional Limits Tracking/Visual Pursuits: Decreased smoothness of eye movement to RIGHT superior field Convergence: Within functional limits Visual Fields: No apparent deficits Additional Comments: Reports a litle blurry in the right eye            Pertinent Vitals/Pain Pain Assessment Pain Assessment: Faces Faces Pain Scale: No hurt     Hand Dominance Right   Extremity/Trunk Assessment Upper Extremity Assessment Upper Extremity Assessment: RUE deficits/detail RUE Deficits / Details: A/ROM WNL in all ranges. Shoulder flexion/abduction: 4+/5, IR/er: 5/5, Gross grasp slightly decreased although functional. RUE Sensation: decreased light touch (Pt reports continued numbness in right hand 2-4 digits.) RUE Coordination: WNL   Lower Extremity Assessment Lower Extremity Assessment: Defer to PT evaluation   Cervical / Trunk Assessment Cervical / Trunk Assessment: Normal   Communication Communication Communication: No difficulties   Cognition Arousal/Alertness: Awake/alert Behavior During Therapy: WFL for tasks assessed/performed Overall  Cognitive Status: Within Functional Limits  for tasks assessed       General Comments  VSS. BP at start of session seated EOB: [birth year]            Home Living Family/patient expects to be discharged to:: Private residence Living Arrangements: Spouse/significant other Available Help at Discharge: Family;Available 24 hours/day Type of Home: House Home Access: Stairs to enter [employer] of Steps: 1 Entrance Stairs-Rails: None Home Layout: Able to live on main level with bedroom/bathroom;Two level     Bathroom Shower/Tub: Occupational psychologist: Standard     Home Equipment: None          Prior Functioning/Environment Prior Level of Function : Independent/Modified Independent;Working/employed;Driving          OT Problem List: Decreased strength;Impaired sensation      OT Treatment/Interventions:   Eval only   OT Goals(Current goals can be found in the care plan section) Acute Rehab OT Goals Patient Stated Goal: to figure out what happened.  OT Frequency:  1X visit    Co-evaluation PT/OT/SLP Co-Evaluation/Treatment: Yes Reason for Co-Treatment: To address functional/ADL transfers   OT goals addressed during session: Strengthening/ROM;ADL's and self-care      AM-PAC OT "6 Clicks" Daily Activity     Outcome Measure Help from another person eating meals?: None Help from another person taking care of personal grooming?: None Help from another person toileting, which includes using toliet, bedpan, or urinal?: None Help from another person bathing (including washing, rinsing, drying)?: None Help from another person to put on and taking off regular upper body clothing?: None Help from another person to put on and taking off regular lower body clothing?: None 6 Click Score: 24   End of Session Equipment Utilized During Treatment: Gait belt Nurse Communication: Mobility status  Activity Tolerance: Patient tolerated treatment well Patient left: in chair;with call bell/phone within  reach;with chair alarm set;with family/visitor present  OT Visit Diagnosis: Muscle weakness (generalized) (M62.81)                Time: [id number] OT Time Calculation (min): 31 min Charges:  OT General Charges $OT Visit: 1 Visit OT Evaluation $OT Eval Moderate Complexity: 1 Mod  [employer], OTR/L,CBIS  Supplemental OT - [facility] and [facility] Secure Chat Preferred    Kiersten Coss, Clarene Duke 06/26/2022, 10:56 AM

## 2022-06-26 NOTE — Progress Notes (Signed)
OT Cancellation Note  Patient Details Name: ARISTON MOERS MRN: [id number] DOB: [birth year]   Cancelled Treatment:    Reason Eval/Treat Not Completed: Active bedrest order (OT will continue to follow pt and evaluate when bedrest has been lifted.)  Ailene Ravel, OTR/L,CBIS  Supplemental OT - [facility] and [facility] Secure Chat Preferred   06/26/2022, 8:18 AM

## 2022-06-26 NOTE — Progress Notes (Signed)
PT Cancellation Note  Patient Details Name: Shaun Williams MRN: [id number] DOB: 1970/08/22   Cancelled Treatment:    Reason Eval/Treat Not Completed: Active bedrest order  Will follow for increased activity orders.    [place]  Office [phone]  Rexanne Mano 06/26/2022, 8:00 AM

## 2022-06-26 NOTE — Discharge Instructions (Addendum)
You were admitted for HA and right sided weakness. We gave you the clot buster medication because of your stroke like symptoms. Good thing is that all your stroke images were negative for stroke. You also had similar episodes twice in the past and no stroke found. Given the history, negative work up and your clinical symptoms, we are wondering whether you have a condition called hemiplegic migraine. However, given benefit of doubt, we will treat you as mini-stroke and start you on aspirin and plavix two mild blood thinners for 3 weeks and then continue aspirin. We put you on cholesterol medication also for stroke prevention. You also need to quit smoking. We will refer you to see one of our headache specialist for follow up and for a second opinion. You will be called from neurology office. Continue follow up with your family doctor within 1-2 weeks after discharge. If you experience any stroke like symptoms again, please call [phone].

## 2022-06-26 NOTE — Evaluation (Signed)
Physical Therapy Evaluation and Discharge Patient Details Name: Shaun Williams MRN: [id number] DOB: 1970/10/13 Today's Date: 06/26/2022  History of Present Illness  52 y.o. male with past medical history significant for hypertension, hyperlipidemia, diabetes, tobacco abuse, obesity, alcohol use, possible TIA versus complex headache. Yesterday, pt experienced a severe headache followed by right upper/lower extremity weakness and right side facial droop. Today MRI is negative. Dr. 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  Clinical Impression   Patient evaluated by Physical Therapy with no further acute PT needs identified. Patient mobilizing with supervision progressing to modified independent with slight gait deviation without imbalance. All education has been completed and the patient has no further questions.  PT is signing off. Thank you for this referral.        Recommendations for follow up therapy are one component of a multi-disciplinary discharge planning process, led by the attending physician.  Recommendations may be updated based on patient status, additional functional criteria and insurance authorization.  Follow Up Recommendations No PT follow up (discussed anticipated return of strength; if has concerns at f/u neurologist appointment they can refer him to [place])      Assistance Recommended at Discharge None  Patient can return home with the following       Equipment Recommendations None recommended by PT  Recommendations for Other Services       Functional Status Assessment Patient has not had a recent decline in their functional status     Precautions / Restrictions Precautions Precautions: Fall Restrictions Weight Bearing Restrictions: No      Mobility  Bed Mobility Overal bed mobility: Modified Independent                  Transfers Overall transfer level: Needs assistance Equipment used: None Transfers: Sit to/from Stand, Bed  to chair/wheelchair/BSC Sit to Stand: Min guard, Supervision   Step pivot transfers: Min guard, Supervision            Ambulation/Gait Ambulation/Gait assistance: Min guard, Modified independent (Device/Increase time) Gait Distance (Feet): 200 Feet Assistive device: None Gait Pattern/deviations: Decreased step length - right, Decreased weight shift to right   Gait velocity interpretation: >2.62 ft/sec, indicative of community ambulatory   General Gait Details: Slight gait deviation but no imbalance, able to vary speed and perform head turns with steady gait  Stairs            Wheelchair Mobility    Modified Rankin (Stroke Patients Only)       Balance Overall balance assessment: No apparent balance deficits (not formally assessed)                                           Pertinent Vitals/Pain Pain Assessment Pain Assessment: Faces Faces Pain Scale: No hurt    Home Living Family/patient expects to be discharged to:: Private residence Living Arrangements: Spouse/significant other Available Help at Discharge: Family;Available 24 hours/day Type of Home: House Home Access: Stairs to enter Entrance Stairs-Rails: None Entrance Stairs-Number of Steps: 1   Home Layout: Able to live on main level with bedroom/bathroom;Two level Home Equipment: None      Prior Function Prior Level of Function : Independent/Modified Independent;Working/employed;Driving             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 Shoulder flexion/abduction: 4+/5, IR/er: 5/5, Gross grasp slightly decreased although functional. RUE Sensation: decreased light touch (Pt reports continued numbness in right hand 2-4 digits.) RUE Coordination:  WNL    Lower Extremity Assessment Lower Extremity Assessment: RLE deficits/detail RLE Deficits / Details: quad strength 4/5; ankle DF 5/5 RLE Sensation: decreased light touch;history of peripheral neuropathy (reports numbness in foot is worse and goes up to mid calf)    Cervical / Trunk Assessment Cervical / Trunk Assessment: Normal  Communication   Communication: No difficulties  Cognition Arousal/Alertness: Awake/alert Behavior During Therapy: WFL for tasks assessed/performed Overall Cognitive Status: Within Functional Limits for tasks assessed                                          General Comments General comments (skin integrity, edema, etc.): VSS. BP at start of session seated EOB: 113/68    Exercises     Assessment/Plan    PT Assessment Patient does not need any further PT services  PT Problem List         PT Treatment Interventions      PT Goals (Current goals can be found in the Care Plan section)  Acute Rehab PT Goals Patient Stated Goal: understand what is causing his symptoms (3rd occurrence over ~10 yrs) PT Goal Formulation: All assessment and education complete, DC therapy    Frequency       Co-evaluation   Reason for Co-Treatment: To address functional/ADL transfers   OT goals addressed during session: Strengthening/ROM;ADL's and self-care       AM-PAC PT "6 Clicks" Mobility  Outcome Measure Help needed turning from your back to your side while in a flat bed without using bedrails?: None Help needed moving from lying on your back to sitting on the side of a flat bed without using bedrails?: None Help needed moving to and from a bed to a chair (including a wheelchair)?: A Little Help needed standing up from a chair using your arms (e.g., wheelchair or bedside chair)?: None Help needed to walk in hospital room?: None Help needed climbing 3-5 steps with a railing? : None 6 Click Score: 23    End of Session Equipment Utilized  During Treatment: Gait belt Activity Tolerance: Patient tolerated treatment well Patient left: in chair;with call bell/phone within reach;with chair alarm set;with family/visitor present Nurse Communication: Mobility status PT Visit Diagnosis: Other abnormalities of gait and mobility (R26.89)    Time: [id number] PT Time Calculation (min) (ACUTE ONLY): 30 min   Charges:   PT Evaluation $PT Eval Low Complexity: [place], PT Acute Rehabilitation Services  Office [phone]   Rexanne Mano 06/26/2022, 12:52 PM

## 2022-07-03 ENCOUNTER — Encounter: Payer: Self-pay | Admitting: Psychiatry

## 2022-07-03 ENCOUNTER — Ambulatory Visit (INDEPENDENT_AMBULATORY_CARE_PROVIDER_SITE_OTHER): Payer: BC Managed Care – PPO | Admitting: Psychiatry

## 2022-07-03 VITALS — BP 124/79 | HR 113 | Ht 70.0 in | Wt 221.1 lb

## 2022-07-03 DIAGNOSIS — R531 Weakness: Secondary | ICD-10-CM | POA: Diagnosis not present

## 2022-07-03 DIAGNOSIS — G43409 Hemiplegic migraine, not intractable, without status migrainosus: Secondary | ICD-10-CM

## 2022-07-03 MED ORDER — DIVALPROEX SODIUM ER 250 MG PO TB24: ORAL_TABLET | ORAL | 0 refills | Status: DC

## 2022-07-03 MED ORDER — NURTEC 75 MG PO TBDP: 75.0000 mg | ORAL_TABLET | ORAL | 8 refills | Status: DC | PRN

## 2022-07-03 NOTE — Progress Notes (Signed)
[facility]  PATIENT: Shaun Williams DOB: February 15, 1971  REFERRING CLINICIAN: Denny Levy, PA HISTORY FROM: self REASON FOR VISIT: stroke vs hemiplegic migraine   HISTORICAL  CHIEF COMPLAINT:  Chief Complaint  Patient presents with   Room 2    Pt is here Alone. Pt states that things have been about the same. Pt states that his right hand hand is numb.    HISTORY OF PRESENT ILLNESS:  The patient presents for evaluation of right sided numbness and weakness. On 06/25/22 he woke up with a dull occipital headache. No associated photophobia, phonophobia, or nausea. Took some Tylenol and the headache improved. Went to work where he noticed paresthesias and difficulty writing with his right hand. Then felt like he was dragging his right leg. Also developed blurry vision in the right eye. He presented to the ED where he was given TNK. MRI brain was normal. CTA head without occlusion or flow-limiting stenosis.  TTE with EF 60-65% without shunt. A1c 6.1 and LDL 122. He was started on crestor 20 mg daily, as well as DAPT x21 days then plan for ASA monotherapy.   Has not had any headaches or vision issues since he was discharged. Continues to feel decreased grip strength and paresthesias in his right 1st-3rd fingers. Feels like he is dragging his leg on the right side. These symptoms fluctuate in severity.   He had a similar episode in 2014 with right sided numbness and right facial droop. Was treated for Bell's palsy at that time. Had another episode in May 2021 with headache and right sided numbness/weakness. Stroke workup at that time including MRI and MRA was unremarkable. He was started on ASA 81 mg daily and lipitor, however he did not continue these medications after discharge.  OTHER MEDICAL CONDITIONS: HTN, HLD, DM, smoking   REVIEW OF SYSTEMS: Full 14 system review of systems performed and negative with exception of: numbness  ALLERGIES: No Known  Allergies  HOME MEDICATIONS: Outpatient Medications Prior to Visit  Medication Sig Dispense Refill   ALPRAZolam (XANAX) 0.5 MG tablet Take 0.25-0.5 mg by mouth daily as needed for anxiety.     aspirin EC 81 MG tablet Take 1 tablet (81 mg total) by mouth daily. Swallow whole. 30 tablet 12   clopidogrel (PLAVIX) 75 MG tablet Take 1 tablet (75 mg total) by mouth daily. 21 tablet 0   dicyclomine (BENTYL) 20 MG tablet Take 1 tablet (20 mg total) by mouth 2 (two) times daily as needed for spasms. 20 tablet 0   Empagliflozin-linaGLIPtin (GLYXAMBI) 25-5 MG TABS Take 25 tablets by mouth daily.     lisinopril-hydrochlorothiazide (PRINZIDE,ZESTORETIC) 20-25 MG per tablet TAKE ONE TABLET BY MOUTH ONCE EVERY DAY (Patient taking differently: Take 1 tablet by mouth 2 (two) times daily.) 7 tablet 0   rosuvastatin (CRESTOR) 20 MG tablet Take 1 tablet (20 mg total) by mouth daily. 30 tablet 3   No facility-administered medications prior to visit.    PAST MEDICAL HISTORY: Past Medical History:  Diagnosis Date   CVA (cerebral vascular accident) ([place]) 2021   Diabetes mellitus without complication ([place])    Hypertension     PAST SURGICAL HISTORY: Past Surgical History:  Procedure Laterality Date   KNEE SURGERY     SHOULDER SURGERY      FAMILY HISTORY: History reviewed. No pertinent family history.  SOCIAL HISTORY: Social History   Socioeconomic History   Marital status: Married    Spouse name: Not on file   Number  of children: Not on file   Years of education: Not on file   Highest education level: Not on file  Occupational History   Not on file  Tobacco Use   Smoking status: Every Day    Types: Cigarettes   Smokeless tobacco: Never  Substance and Sexual Activity   Alcohol use: Not Currently   Drug use: No   Sexual activity: Not on file  Other Topics Concern   Not on file  Social History Narrative   Not on file   Social Determinants of Health   Financial Resource Strain: Not on  file  Food Insecurity: Not on file  Transportation Needs: Not on file  Physical Activity: Not on file  Stress: Not on file  Social Connections: Not on file  Intimate Partner Violence: Not on file     PHYSICAL EXAM  GENERAL EXAM/CONSTITUTIONAL: Vitals:  Vitals:   07/03/22 1248  BP: 124/79  Pulse: (!) 113  Weight: 221 lb 0.8 oz (100.3 kg)  Height: '5\' 10"'$  (1.778 m)   Body mass index is 31.72 kg/m. Wt Readings from Last 3 Encounters:  07/03/22 221 lb 0.8 oz (100.3 kg)  06/25/22 223 lb (101.2 kg)  02/27/21 233 lb (105.7 kg)     NEUROLOGIC: MENTAL STATUS:  awake, alert, oriented to person, place and time recent and remote memory intact normal attention and concentration language fluent, comprehension intact, naming intact fund of knowledge appropriate  CRANIAL NERVE:  2nd, 3rd, 4th, 6th - pupils equal and reactive to light, visual fields full to confrontation, extraocular muscles intact, no nystagmus 5th - decreased sensation right V2 7th - facial strength symmetric 8th - hearing intact 9th - palate elevates symmetrically, uvula midline 11th - shoulder shrug symmetric 12th - tongue protrusion midline  MOTOR:  normal bulk and tone, RLE 4+/5 hip flexion and dorsiflexion with some giveway weakness, otherwise full strength all 4 extremities  SENSORY:  Decreased sensation to light touch right hand, RLE  COORDINATION:  finger-nose-finger, fine finger movements normal  REFLEXES:  deep tendon reflexes present and symmetric  GAIT/STATION:  normal     DIAGNOSTIC DATA (LABS, IMAGING, TESTING) - I reviewed patient records, labs, notes, testing and imaging myself where available.  Lab Results  Component Value Date   WBC 11.8 (H) 06/26/2022   HGB 15.5 06/26/2022   HCT 44.5 06/26/2022   MCV 92.5 06/26/2022   PLT 204 06/26/2022      Component Value Date/Time   NA 135 06/26/2022 0344   K 2.9 (L) 06/26/2022 0344   CL 98 06/26/2022 0344   CO2 26 06/26/2022 0344    GLUCOSE 118 (H) 06/26/2022 0344   BUN 14 06/26/2022 0344   CREATININE 0.84 06/26/2022 0344   CALCIUM 9.0 06/26/2022 0344   PROT 7.7 06/25/2022 0709   ALBUMIN 4.3 06/25/2022 0709   AST 17 06/25/2022 0709   ALT 26 06/25/2022 0709   ALKPHOS 49 06/25/2022 0709   BILITOT 0.6 06/25/2022 0709   GFRNONAA >60 06/26/2022 0344   GFRAA >60 10/01/2019 1908   Lab Results  Component Value Date   CHOL 176 06/26/2022   HDL 25 (L) 06/26/2022   LDLCALC 122 (H) 06/26/2022   TRIG 143 06/26/2022   CHOLHDL 7.0 06/26/2022   Lab Results  Component Value Date   HGBA1C 6.1 (H) 06/26/2022      ASSESSMENT AND PLAN  52 y.o. year old male with a history of HTN, HLD, DM, smoking who presents for evaluation of right sided numbness/weakness and  headache. MRI brain was normal. Suspect his symptoms represent hemiplegic migraine given multiple episodes associated with headache with negative stroke workup. Triptans are contraindicated with TIA/hemiplegic migraine. Will prescribe Nurtec as needed and start a Depakote taper to try to break his migraine. Will continue DAPT followed by ASA monotherapy as DWI negative stroke cannot be fully ruled out.   1. 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    Dispense:  8 tablet    Refill:  8   divalproex (DEPAKOTE ER) 250 MG 24 hr tablet    Sig: Take 2 tablets (500 mg total) by mouth daily for 5 days, THEN 1 tablet (250 mg total) daily for 5 days. Then stop.    Dispense:  15 tablet    Refill:  0    Return in about 8 months (around 03/03/2023).    Genia Harold,  MD 07/03/22 1:30 PM  I spent an average of 42 minutes chart reviewing and counseling the patient, with at least 50% of the time face to face with the patient.   [facility] [address] [place], [place] [address] [phone]

## 2022-07-16 ENCOUNTER — Other Ambulatory Visit (HOSPITAL_COMMUNITY): Payer: Self-pay

## 2022-07-16 ENCOUNTER — Telehealth: Payer: Self-pay | Admitting: Psychiatry

## 2022-07-16 ENCOUNTER — Encounter: Payer: Self-pay | Admitting: *Deleted

## 2022-07-16 NOTE — Telephone Encounter (Signed)
Patient aware letter has been signed and placed at check in for pick up.

## 2022-07-16 NOTE — Telephone Encounter (Signed)
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

## 2022-07-16 NOTE — Telephone Encounter (Signed)
Called pt.  Dr. Billey Gosling told him to take two weeks off when she saw him for initial consult 07/03/22. Told him to call back at that point to let us know how he feels/if he wants to return to work. He feels ok to return to work.  He can pick up letter. Needs to bring to work tomorrow. Aware Dr. Billey Gosling out and I will discuss with covering MD.

## 2022-07-16 NOTE — Telephone Encounter (Signed)
Spoke with Dr. Felecia Shelling. He approved providing letter. Wrote letter. Waiting on MD to sign.

## 2022-07-30 ENCOUNTER — Other Ambulatory Visit (HOSPITAL_COMMUNITY): Payer: Self-pay | Admitting: Family Medicine

## 2022-07-30 DIAGNOSIS — F17210 Nicotine dependence, cigarettes, uncomplicated: Secondary | ICD-10-CM

## 2022-08-29 ENCOUNTER — Ambulatory Visit: Payer: BC Managed Care – PPO | Admitting: Neurology

## 2022-09-04 IMAGING — CT CT ABD-PELV W/ CM
2 of 5 series · 15 of 46 positions shown, 17 images · IV contrast (omnipaque)
Comparison: None.

CLINICAL DATA: RLQ pain x 2 days with nausea. Question appendicitis

EXAM:
CT ABDOMEN AND PELVIS WITH CONTRAST
TECHNIQUE: Multidetector CT imaging of the abdomen and pelvis was performed
using the standard protocol following bolus administration of
intravenous contrast.
CONTRAST:  100mL OMNIPAQUE IOHEXOL 300 MG/ML  SOLN

[Series 2: axial st · axial · 0.92mm/px · z∈[+114,+589]mm · 12 of 109 slices shown, 14 images]
[im 7/109  soft-tissue]
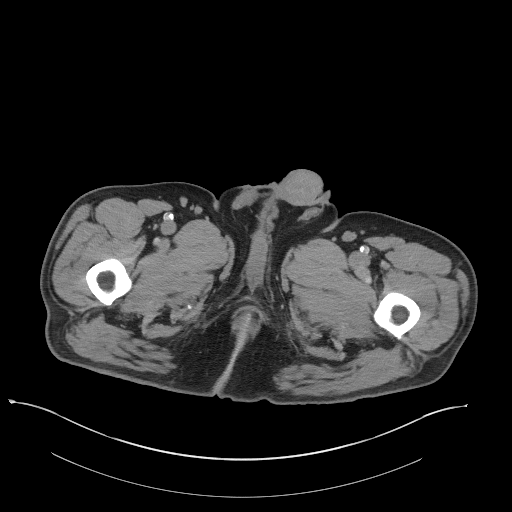
[im 7/109  bone]
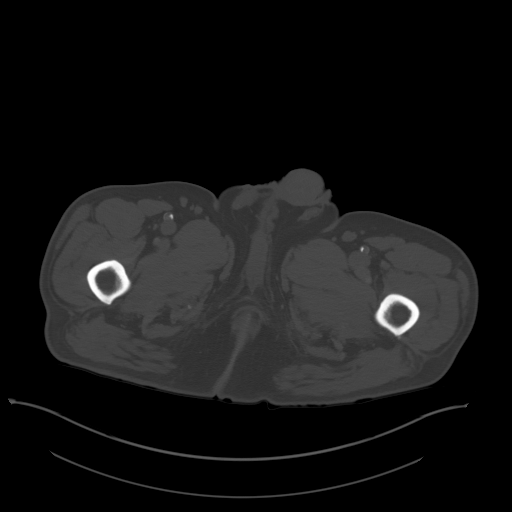
[im 20/109  soft-tissue]
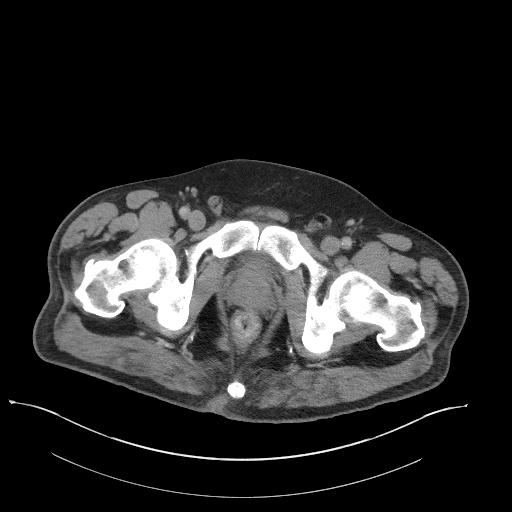
[im 26/109  soft-tissue]
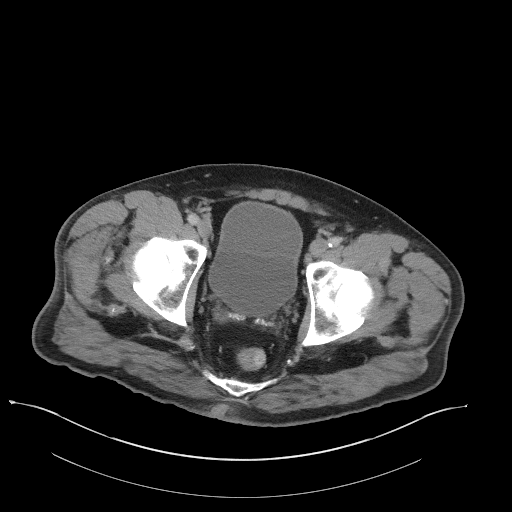
[im 32/109  soft-tissue]
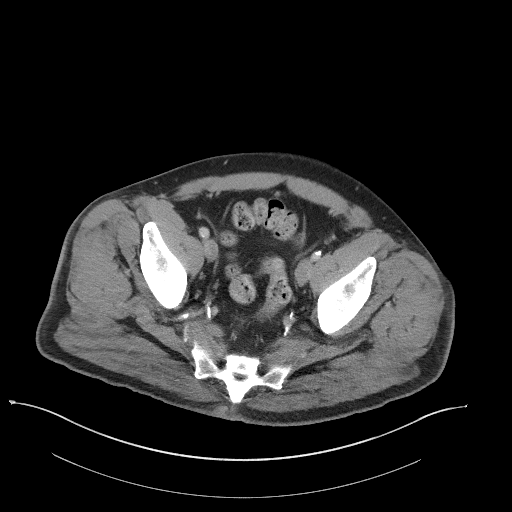
[im 45/109  soft-tissue]
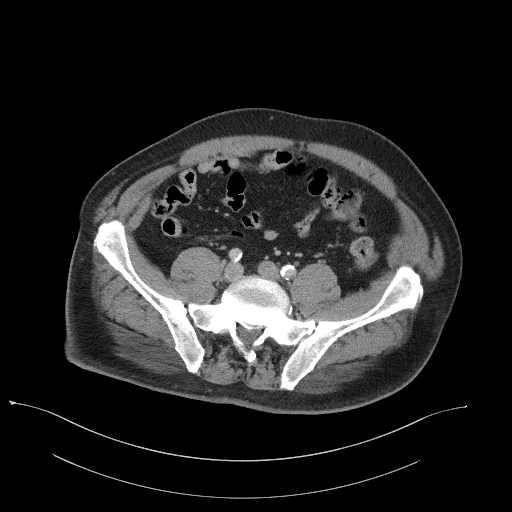
[im 51/109  soft-tissue]
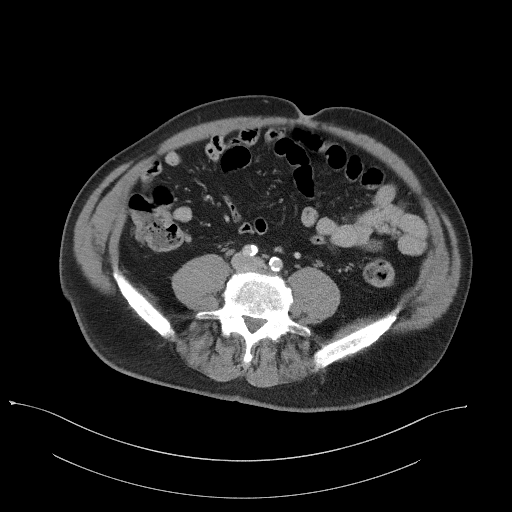
[im 58/109  soft-tissue]
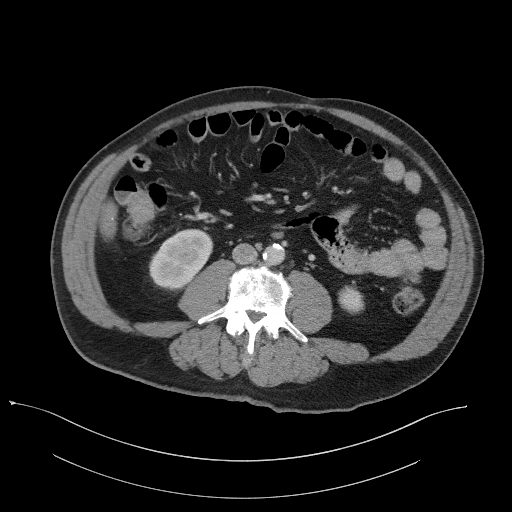
[im 70/109  soft-tissue]
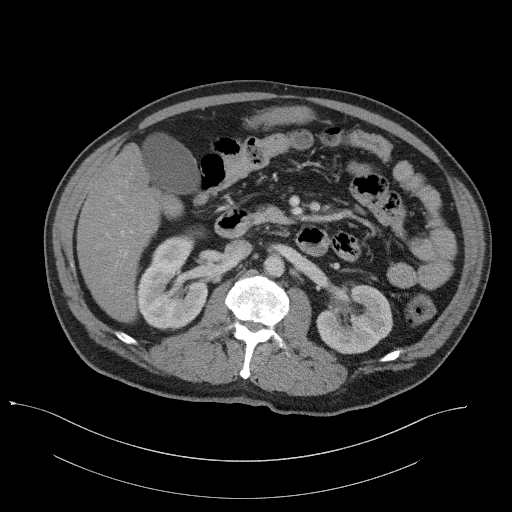
[im 77/109  soft-tissue]
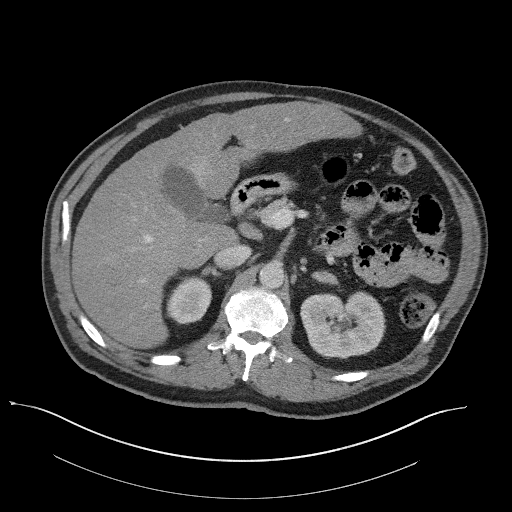
[im 77/109  bone]
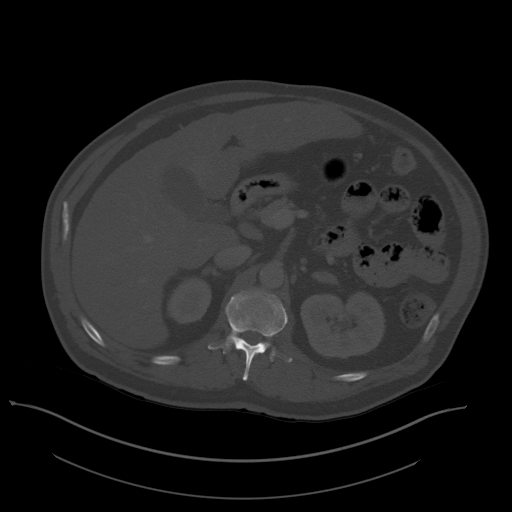
[im 83/109  soft-tissue]
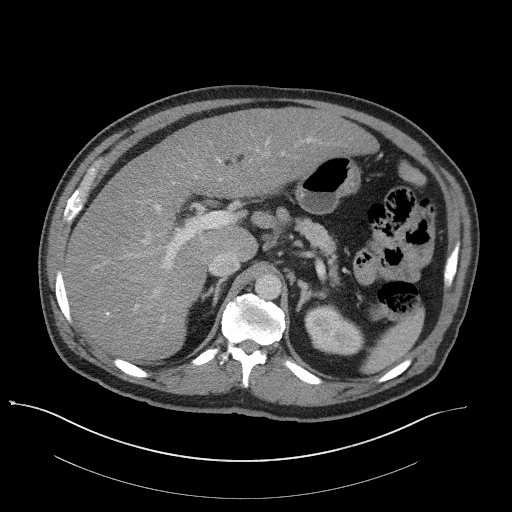
[im 96/109  soft-tissue]
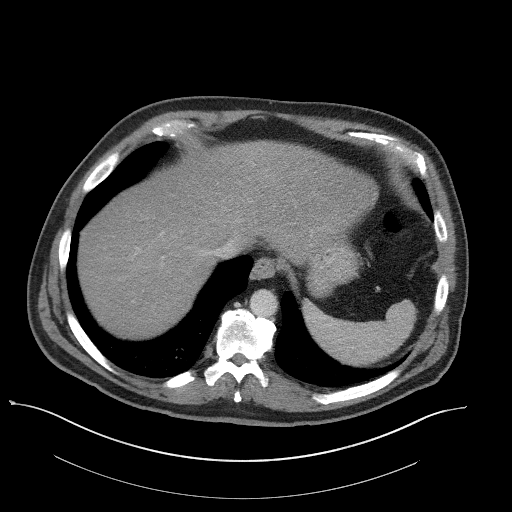
[im 102/109  soft-tissue]
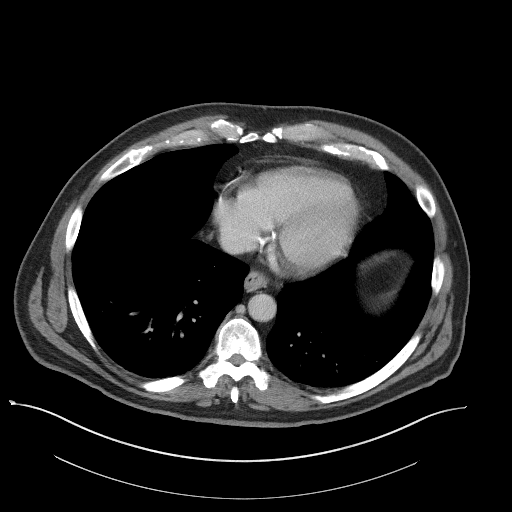

[Series 5: coronal st · coronal · 0.89mm/px · 3 of 112 slices shown]
[im 38/112  soft-tissue]
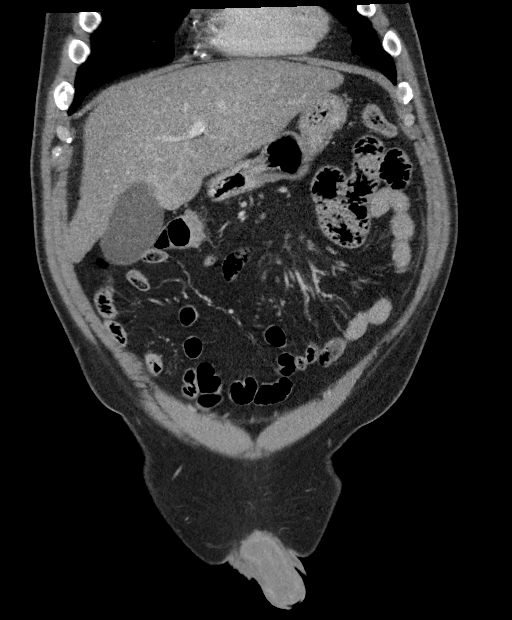
[im 50/112  soft-tissue]
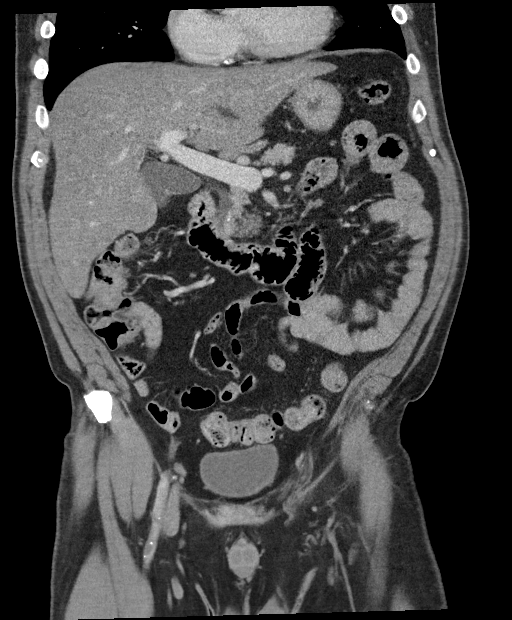
[im 62/112  soft-tissue]
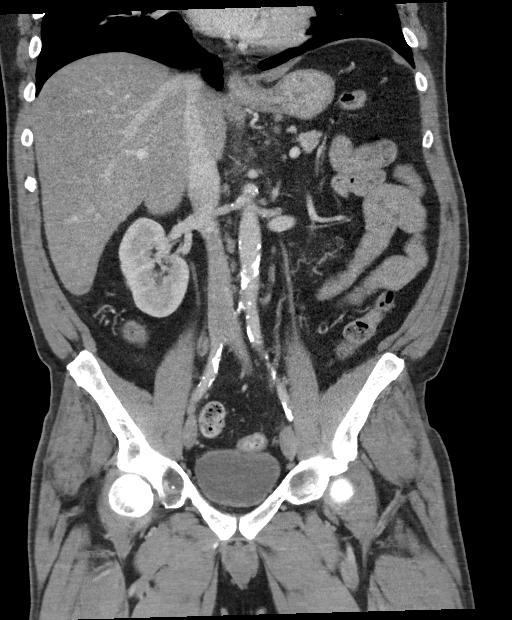

[15 of 46 positions shown; findings below may reference images not displayed]

FINDINGS: Lower chest: Bilateral lower lobe subsegmental atelectasis. Coronary
artery calcifications.

Hepatobiliary: Question nodular hepatic contour. Prominent caudate
lobe. The hepatic parenchyma is diffusely mildly hypodense compared
to the splenic parenchyma consistent with fatty infiltration. No
focal liver abnormality. No gallstones, gallbladder wall thickening,
or pericholecystic fluid. No biliary dilatation.

Pancreas: No focal lesion. Normal pancreatic contour. No surrounding
inflammatory changes. No main pancreatic ductal dilatation.

Spleen: Normal in size without focal abnormality.

Adrenals/Urinary Tract:

No adrenal nodule bilaterally.

Bilateral kidneys enhance symmetrically. Subcentimeter hypodensities
are too small to characterize.

No hydronephrosis. No hydroureter.

The urinary bladder is unremarkable.

On delayed imaging, there is no urothelial wall thickening and there
are no filling defects in the opacified portions of the bilateral
collecting systems or ureters.

Stomach/Bowel: Stomach is within normal limits. No evidence of bowel
wall thickening or dilatation. Few scattered colonic diverticula.
Appendix appears normal.

Vascular/Lymphatic: The main portal, splenic, superior mesenteric
veins are patent. No abdominal aorta or iliac aneurysm. At least
moderate atherosclerotic plaque of the aorta and its branches. A
cm perigastric lymph node ([DATE]). A 1.1 cm left periaortic lymph
node ([DATE]). No pelvic or inguinal lymphadenopathy.

Reproductive: Prostate is unremarkable.

Other: No intraperitoneal free fluid. No intraperitoneal free gas.
No organized fluid collection.

Musculoskeletal:

No abdominal wall hernia or abnormality.

No suspicious lytic or blastic osseous lesions. No acute displaced
fracture. Multilevel degenerative changes of the spine.
IMPRESSION: 1. Normal appendix.
2. Hepatic steatosis with query of a cirrhotic morphology. Recommend
right upper quadrant ultrasound for further evaluation.
3. Nonspecific borderline enlarged left periaortic and perigastric
lymph nodes. Attention on follow-up.
4.  Aortic Atherosclerosis ([id number]).

## 2022-09-05 IMAGING — US US ABDOMEN LIMITED
1 series · 14 of 25 positions shown · non-contrast
Comparison: CT AP 02/27/2021

CLINICAL DATA: Right upper quadrant pain. Morphologic features of
the liver on CT suggestive of cirrhosis.

EXAM:
ULTRASOUND ABDOMEN LIMITED RIGHT UPPER QUADRANT

[Series 1: us abdomen limited ruq (liver/gb) · 14 of 93 slices shown]
[im 1/93]
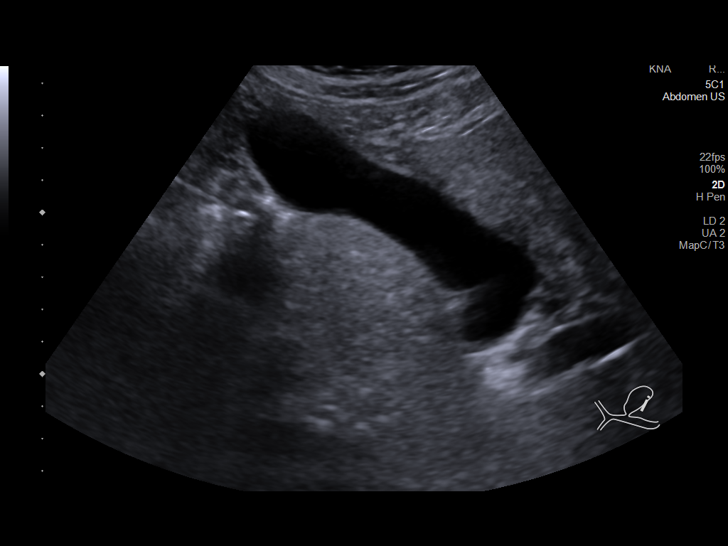
[im 8/93]
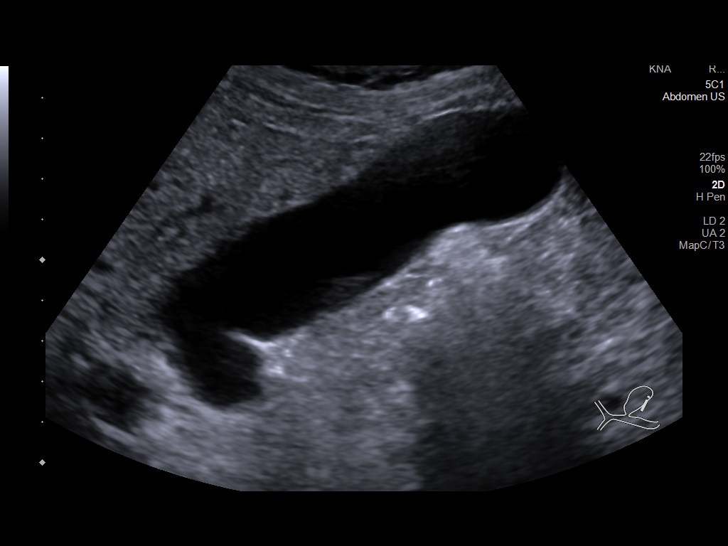
[im 16/93]
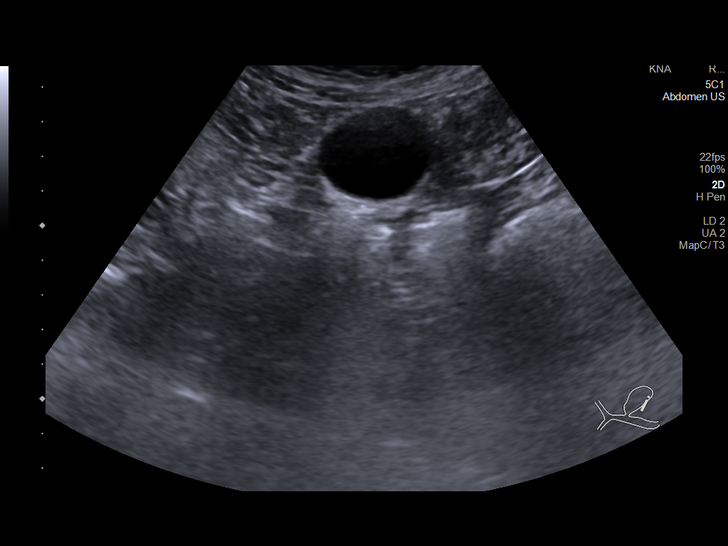
[im 24/93]
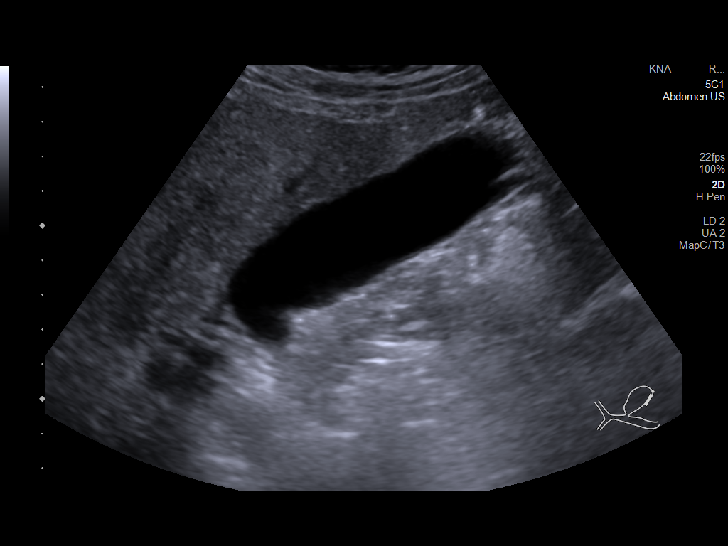
[im 31/93]
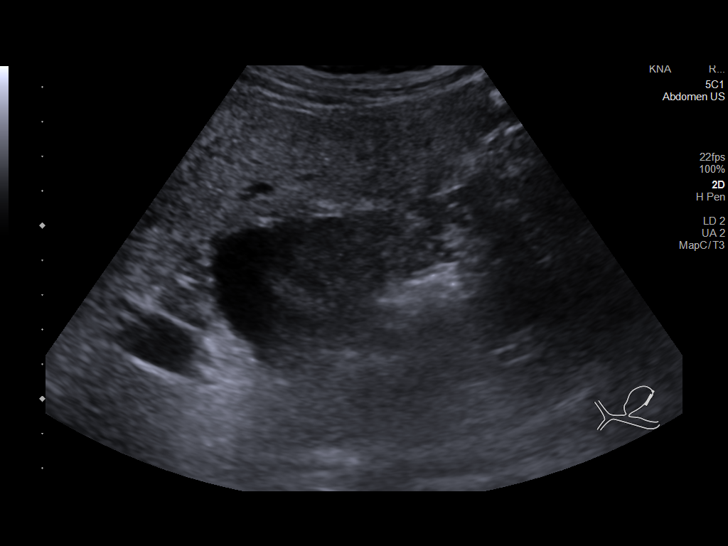
[im 35/93]
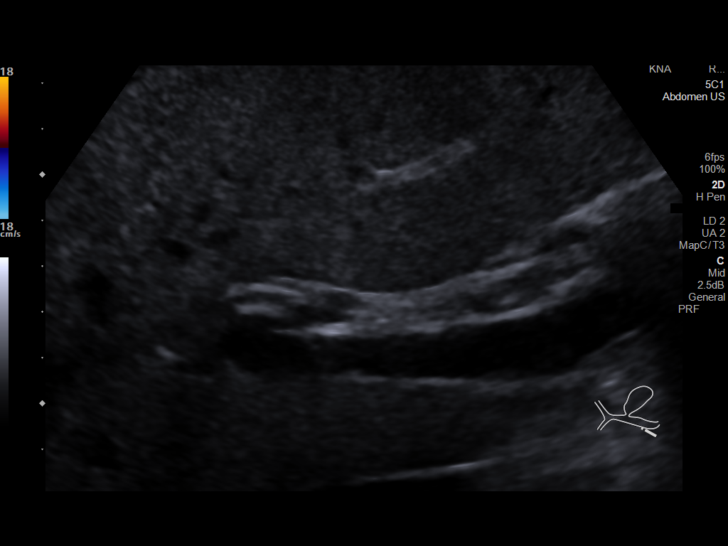
[im 43/93]
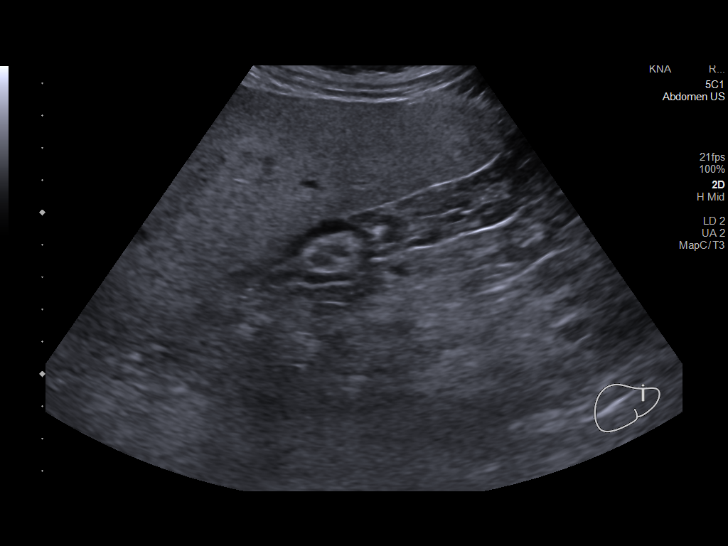
[im 50/93]
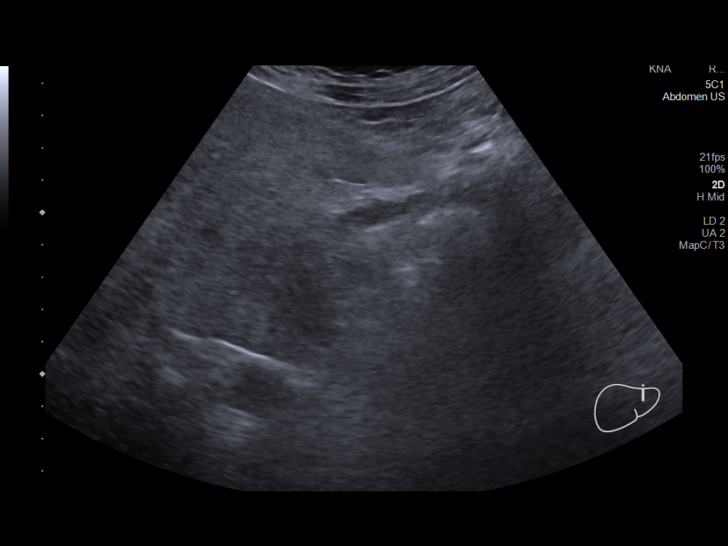
[im 58/93]
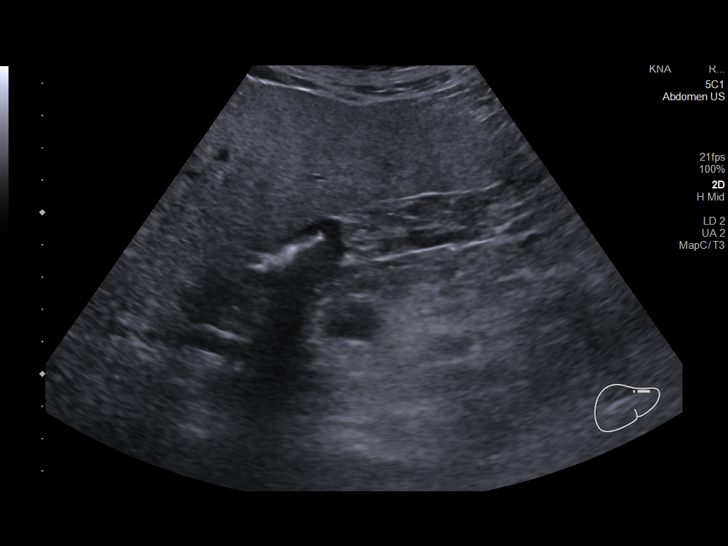
[im 62/93]
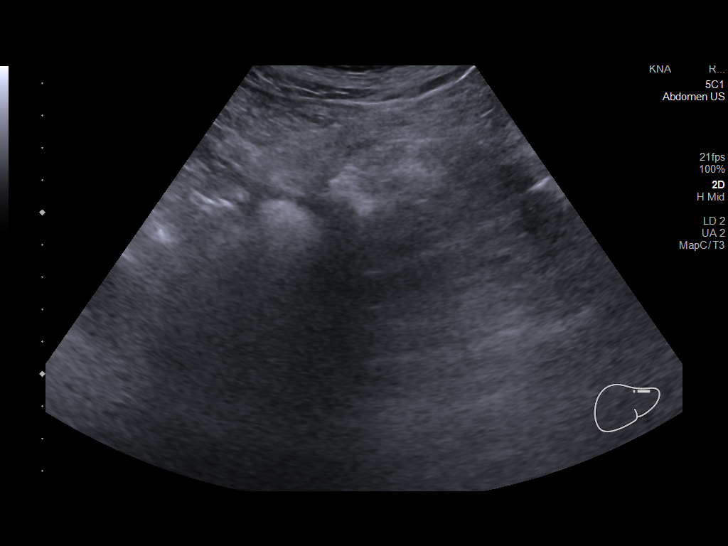
[im 70/93]
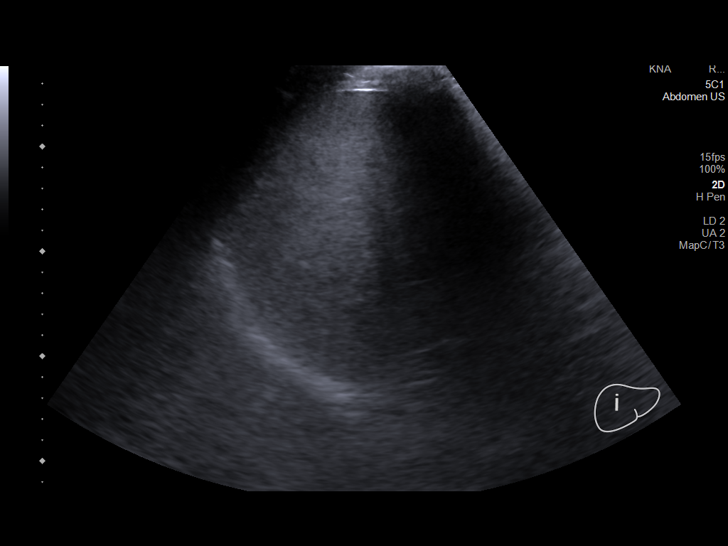
[im 77/93]
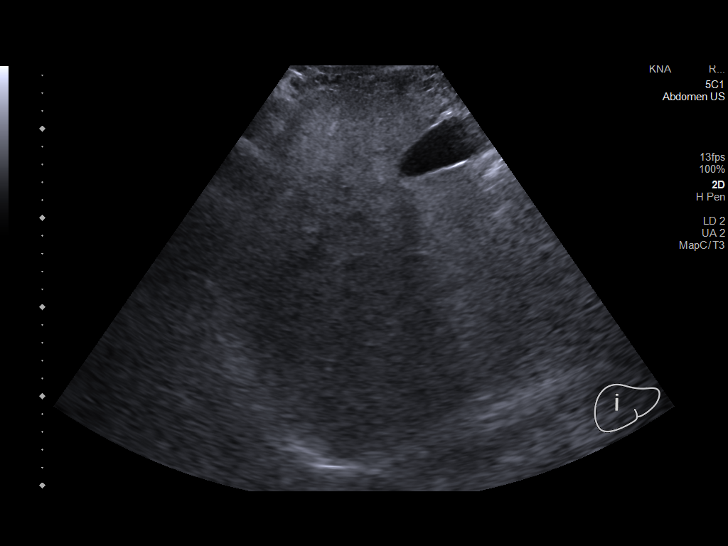
[im 85/93]
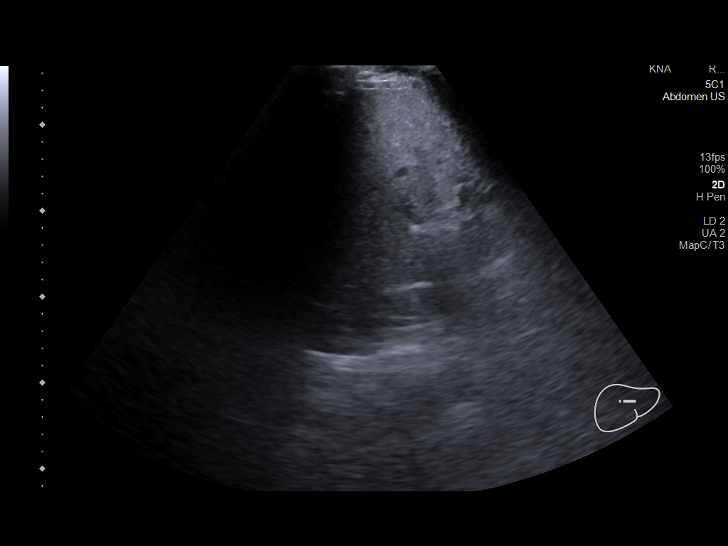
[im 93/93]
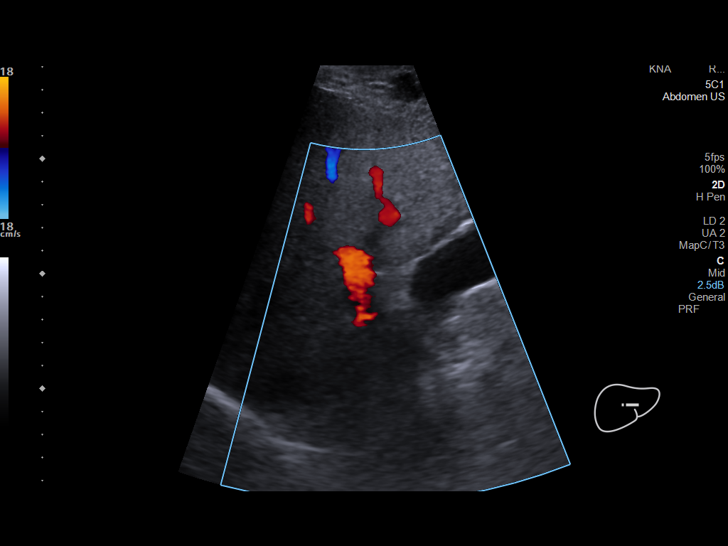

[14 of 25 positions shown; findings below may reference images not displayed]

FINDINGS: Gallbladder:

No gallstones or wall thickening visualized. No sonographic Murphy
sign noted by sonographer.

Common bile duct:

Diameter: 3.0 mm

Liver:

Increased parenchymal echogenicity suggestive of hepatic steatosis.
Contour the liver appears nodular. No focal liver abnormality.
Portal vein is patent on color Doppler imaging with normal direction
of blood flow towards the liver.

Other: None.
IMPRESSION: 1. No acute abnormality.
2. Hepatic steatosis with nodular contour the liver suggestive of
cirrhosis.

## 2022-09-19 ENCOUNTER — Telehealth: Payer: Self-pay

## 2022-09-19 ENCOUNTER — Other Ambulatory Visit (HOSPITAL_COMMUNITY): Payer: Self-pay

## 2022-09-19 NOTE — Telephone Encounter (Signed)
Patient Advocate Encounter   Received notification from [employer] [place] that prior authorization is required for Nurtec 75MG  dispersible tablets   Submitted: 09-19-2022 Key [id number]  Status is pending

## 2022-09-23 ENCOUNTER — Other Ambulatory Visit (HOSPITAL_COMMUNITY): Payer: Self-pay

## 2022-09-23 NOTE — Telephone Encounter (Signed)
Pharmacy Patient Advocate Encounter  Prior Authorization for Nurtec 75MG  ODT has been approved by [employer] (ins).    PA # [id number] Effective dates: 08/23/2022 through 09/22/2023  Copay is $0

## 2022-10-03 ENCOUNTER — Other Ambulatory Visit: Payer: Self-pay

## 2022-10-03 NOTE — Patient Outreach (Signed)
First telephone outreach attempt to obtain mRS. No answer. Left message for returned call.  Meah Jiron [facility]-Care Management Assistant [phone]  

## 2022-10-05 ENCOUNTER — Other Ambulatory Visit: Payer: Self-pay

## 2022-10-05 NOTE — Patient Outreach (Signed)
Second telephone outreach attempt to obtain mRS. No answer. Left message for returned call.  [name] [facility]-Care Management Assistant [phone]  

## 2022-10-08 ENCOUNTER — Other Ambulatory Visit: Payer: Self-pay

## 2022-10-08 NOTE — Patient Outreach (Unsigned)
3 outreach attempts were completed to obtain mRs. mRs could not be obtained because patient never returned my calls. mRs=7    Shaun Williams Care Management Assistant [phone]  

## 2022-11-27 ENCOUNTER — Telehealth: Payer: Self-pay | Admitting: Psychiatry

## 2022-11-27 NOTE — Telephone Encounter (Signed)
LVM and sent mychart msg informing pt of need to reschedule 02/25/23 appt - MD leaving practice  If pt calls back to reschedule, ok to schedule with NP or can schedule with another provider who sees for symptoms pt was diagnosed with

## 2023-02-25 ENCOUNTER — Ambulatory Visit: Payer: BC Managed Care – PPO | Admitting: Psychiatry

## 2023-03-04 ENCOUNTER — Ambulatory Visit: Payer: BC Managed Care – PPO | Admitting: Psychiatry

## 2023-10-16 ENCOUNTER — Emergency Department (HOSPITAL_COMMUNITY)

## 2023-10-16 ENCOUNTER — Other Ambulatory Visit: Payer: Self-pay

## 2023-10-16 ENCOUNTER — Emergency Department (HOSPITAL_COMMUNITY)
Admission: EM | Admit: 2023-10-16 | Discharge: 2023-10-16 | Disposition: A | Discharge status: Home / Self Care | Attending: Emergency Medicine | Admitting: Emergency Medicine

## 2023-10-16 ENCOUNTER — Encounter (HOSPITAL_COMMUNITY): Payer: Self-pay | Admitting: Emergency Medicine

## 2023-10-16 DIAGNOSIS — Z7982 Long term (current) use of aspirin: Secondary | ICD-10-CM | POA: Diagnosis not present

## 2023-10-16 DIAGNOSIS — Z79899 Other long term (current) drug therapy: Secondary | ICD-10-CM | POA: Diagnosis not present

## 2023-10-16 DIAGNOSIS — R531 Weakness: Secondary | ICD-10-CM | POA: Insufficient documentation

## 2023-10-16 DIAGNOSIS — I10 Essential (primary) hypertension: Secondary | ICD-10-CM | POA: Diagnosis not present

## 2023-10-16 DIAGNOSIS — E876 Hypokalemia: Secondary | ICD-10-CM | POA: Diagnosis not present

## 2023-10-16 DIAGNOSIS — R202 Paresthesia of skin: Secondary | ICD-10-CM | POA: Diagnosis present

## 2023-10-16 DIAGNOSIS — G43109 Migraine with aura, not intractable, without status migrainosus: Secondary | ICD-10-CM

## 2023-10-16 LAB — I-STAT CHEM 8, ED
BUN: 18 mg/dL (ref 6–20)
Calcium, Ion: 1.13 mmol/L — ABNORMAL LOW (ref 1.15–1.40)
Chloride: 100 mmol/L (ref 98–111)
Creatinine, Ser: 0.8 mg/dL (ref 0.61–1.24)
Glucose, Bld: 140 mg/dL — ABNORMAL HIGH (ref 70–99)
HCT: 48 % (ref 39.0–52.0)
Hemoglobin: 16.3 g/dL (ref 13.0–17.0)
Potassium: 3.1 mmol/L — ABNORMAL LOW (ref 3.5–5.1)
Sodium: 139 mmol/L (ref 135–145)
TCO2: 25 mmol/L (ref 22–32)

## 2023-10-16 LAB — DIFFERENTIAL
Abs Immature Granulocytes: 0.04 10*3/uL (ref 0.00–0.07)
Basophils Absolute: 0.1 10*3/uL (ref 0.0–0.1)
Basophils Relative: 1 %
Eosinophils Absolute: 0.2 10*3/uL (ref 0.0–0.5)
Eosinophils Relative: 1 %
Immature Granulocytes: 0 %
Lymphocytes Relative: 26 %
Lymphs Abs: 2.7 10*3/uL (ref 0.7–4.0)
Monocytes Absolute: 0.7 10*3/uL (ref 0.1–1.0)
Monocytes Relative: 7 %
Neutro Abs: 6.7 10*3/uL (ref 1.7–7.7)
Neutrophils Relative %: 65 %

## 2023-10-16 LAB — RAPID URINE DRUG SCREEN, HOSP PERFORMED
Amphetamines: NOT DETECTED
Barbiturates: NOT DETECTED
Benzodiazepines: NOT DETECTED
Cocaine: NOT DETECTED
Opiates: NOT DETECTED
Tetrahydrocannabinol: NOT DETECTED

## 2023-10-16 LAB — ETHANOL: Alcohol, Ethyl (B): <15 mg/dL (ref <15)

## 2023-10-16 LAB — CBG MONITORING, ED: Glucose-Capillary: 141 mg/dL — ABNORMAL HIGH (ref 70–99)

## 2023-10-16 LAB — COMPREHENSIVE METABOLIC PANEL WITH GFR
ALT: 22 U/L (ref 0–44)
AST: 16 U/L (ref 15–41)
Albumin: 4.2 g/dL (ref 3.5–5.0)
Alkaline Phosphatase: 52 U/L (ref 38–126)
Anion gap: 11 (ref 5–15)
BUN: 17 mg/dL (ref 6–20)
CO2: 26 mmol/L (ref 22–32)
Calcium: 9.1 mg/dL (ref 8.9–10.3)
Chloride: 101 mmol/L (ref 98–111)
Creatinine, Ser: 0.83 mg/dL (ref 0.61–1.24)
GFR, Estimated: >60 mL/min (ref >60)
Glucose, Bld: 141 mg/dL — ABNORMAL HIGH (ref 70–99)
Potassium: 3 mmol/L — ABNORMAL LOW (ref 3.5–5.1)
Sodium: 138 mmol/L (ref 135–145)
Total Bilirubin: 0.4 mg/dL (ref 0.0–1.2)
Total Protein: 7.4 g/dL (ref 6.5–8.1)

## 2023-10-16 LAB — CBC
HCT: 46.4 % (ref 39.0–52.0)
Hemoglobin: 15.8 g/dL (ref 13.0–17.0)
MCH: 31.7 pg (ref 26.0–34.0)
MCHC: 34.1 g/dL (ref 30.0–36.0)
MCV: 93 fL (ref 80.0–100.0)
Platelets: 228 10*3/uL (ref 150–400)
RBC: 4.99 MIL/uL (ref 4.22–5.81)
RDW: 13.4 % (ref 11.5–15.5)
WBC: 10.4 10*3/uL (ref 4.0–10.5)
nRBC: 0 % (ref 0.0–0.2)

## 2023-10-16 LAB — APTT: aPTT: 27 s (ref 24–36)

## 2023-10-16 LAB — MAGNESIUM: Magnesium: 1.9 mg/dL (ref 1.7–2.4)

## 2023-10-16 LAB — PROTIME-INR
INR: 1 (ref 0.8–1.2)
Prothrombin Time: 13 s (ref 11.4–15.2)

## 2023-10-16 MED ORDER — POTASSIUM CHLORIDE 10 MEQ/100ML IV SOLN
10.0000 meq | INTRAVENOUS | Status: AC
  Administered 2023-10-16 (×2): 10 meq via INTRAVENOUS
  Filled 2023-10-16 (×2): qty 100

## 2023-10-16 MED ORDER — VALPROATE SODIUM 100 MG/ML IV SOLN
1000.0000 mg | Freq: Once | INTRAVENOUS | Status: AC
  Administered 2023-10-16: 1000 mg via INTRAVENOUS
  Filled 2023-10-16: qty 10

## 2023-10-16 MED ORDER — POTASSIUM CHLORIDE CRYS ER 20 MEQ PO TBCR
40.0000 meq | EXTENDED_RELEASE_TABLET | Freq: Once | ORAL | Status: AC
  Administered 2023-10-16: 40 meq via ORAL
  Filled 2023-10-16: qty 2

## 2023-10-16 MED ORDER — METOCLOPRAMIDE HCL 5 MG/ML IJ SOLN
10.0000 mg | Freq: Once | INTRAMUSCULAR | Status: AC
  Administered 2023-10-16: 10 mg via INTRAVENOUS
  Filled 2023-10-16: qty 2

## 2023-10-16 MED ORDER — GABAPENTIN 100 MG PO CAPS: 100.0000 mg | ORAL_CAPSULE | Freq: Three times a day (TID) | ORAL | 0 refills | Status: AC | PRN

## 2023-10-16 MED ORDER — PROCHLORPERAZINE EDISYLATE 10 MG/2ML IJ SOLN
10.0000 mg | Freq: Once | INTRAMUSCULAR | Status: DC
  Filled 2023-10-16: qty 2

## 2023-10-16 MED ORDER — POTASSIUM CHLORIDE ER 10 MEQ PO TBCR: 10.0000 meq | EXTENDED_RELEASE_TABLET | Freq: Every day | ORAL | 0 refills | Status: AC

## 2023-10-16 MED ORDER — IOHEXOL 350 MG/ML SOLN
75.0000 mL | Freq: Once | INTRAVENOUS | Status: AC | PRN
  Administered 2023-10-16: 75 mL via INTRAVENOUS

## 2023-10-16 MED ORDER — MAGNESIUM SULFATE 2 GM/50ML IV SOLN
2.0000 g | Freq: Once | INTRAVENOUS | Status: AC
  Administered 2023-10-16: 2 g via INTRAVENOUS
  Filled 2023-10-16: qty 50

## 2023-10-16 NOTE — ED Notes (Signed)
 Pt in MRI.

## 2023-10-16 NOTE — Progress Notes (Signed)
 [facility] Telestroke RN Code Stroke Note:  0806- Stroke cart activation- EMS pre alert  [phone]- Neuro paged  0809- Dr Alecia Ames on camera  [phone]- Pt arrived via EMS, EDP at bedside. NIH started  0814- Taken for CT  0818- NIH completed after NCCT completed. CTA ordered  0835- Pt taken for MRI  0849- Per Dr Alecia Ames MRI neg for acute stroke. No TNK   LKW 0730 MRS 0

## 2023-10-16 NOTE — Discharge Instructions (Addendum)
 Please reach out to your primary care office as well to recheck your potassium levels.  Take the supplements prescribed.  You may need adjustments or changes to your home medications if your potassium remains low.

## 2023-10-16 NOTE — ED Notes (Signed)
 Pt ambulated around the nurses station with no complaints

## 2023-10-16 NOTE — ED Notes (Signed)
 EMS Vitals  BP 129/80, 154/72 CBG 132

## 2023-10-16 NOTE — Consult Note (Signed)
 [facility] Telemedicine Consult   Requesting Provider: Elihu Grumet Consult Participants: Bedside nurse, patient Location of the provider: [facility] Location of the patient: [facility]  This consult was provided via telemedicine with 2-way video and audio communication. The patient/family was informed that care would be provided in this way and agreed to receive care in this manner.    Chief Complaint: Right  sided weakness  HPI: 53 yo M With right sided weakness and tingling that started at 7:30 this morning.  He states that he first noticed it in his face and arm.  He also has noticed some change in his vision around the same time.  He has had similar episodes with headache in the past, but denies any headache today.  He states that the symptoms have been fairly static since commencing.   Of note, this is at least his 4th presentation with nearly identical symptoms, the previous three have had negative workup and there has suspicion for complicated migraine.   LKW: 7:30 am tnk given?: No, not a stroke IR Thrombectomy? No, outside of window Modified Rankin Scale: 0-Completely asymptomatic and back to baseline post- stroke Time of teleneurologist evaluation: 8:09  Exam: There were no vitals filed for this visit.  General: in bed, NAD  1A: Level of Consciousness - 0 1B: Ask Month and Age - 0 1C: 'Blink Eyes' & 'Squeeze Hands' - 0 2: Test Horizontal Extraocular Movements - 0 3: Test Visual Fields - 1 4: Test Facial Palsy - 1 5A: Test Left Arm Motor Drift - 0 5B: Test Right Arm Motor Drift - 1 6A: Test Left Leg Motor Drift - 0 6B: Test Right Leg Motor Drift - 1 7: Test Limb Ataxia - 0 8: Test Sensation - 1 9: Test Language/Aphasia- 0 10: Test Dysarthria - 0 11: Test Extinction/Inattention - 0 NIHSS score: 5  Able to walk from the stretcher to CT table without   Imaging Reviewed: CT/CTA - negative  Labs reviewed in epic and pertinent values follow: CBG  141   Assessment: 53 year old male with recurrent episodes of right-sided tingling, mild weakness, visual change typically associated with headache, though without headache today.  He was evaluated emergently by me via telepresence for consideration of IV tenecteplase  which he has received for similar symptoms in the past.  Given the presence of positive symptoms, recurrent stereotyped events, and previous negative workup I favored getting further ancillary testing prior to considering thrombolytics as I felt that complicated migraine aura without headache was much more likely than acute ischemic stroke.  He was therefore taken emergently for an MRI which was done without delay and I reviewed these images which revealed no evidence of acute ischemia.  Recommendations:  Initially recommended CTA and MRI, both of which have been completed at the time of finalizing this note and are negative I would treat this as migraine, could consider Reglan or Compazine, Toradol, IV Depacon or IV magnesium As long as his symptoms improved, no further workup is needed at this time.    Ann Keto, MD [facility] [phone]  If 7pm- 7am, please page neurology on call as listed in AMION.

## 2023-10-16 NOTE — ED Provider Notes (Signed)
 [facility] AT [facility] Provider Note   CSN: [id number] Arrival date & time: 10/16/23  4098  An emergency department physician performed an initial assessment on this suspected stroke patient at [phone].  History  Chief Complaint  Patient presents with   Code Stroke    Shaun Williams is a 53 y.o. male presenting with right sided paresthesias and weakness onset 730 AM this morning.  Woke up feeling fine.  Hx of CVA in the past, HTN, HLD  Similar episodes in the past with right sided paresthesias  MRI brain Feb 2024 noting anterior frontal lobe subcortical white matter hyperintensities unchanged from prior imaigng  HPI     Home Medications Prior to Admission medications   Medication Sig Start Date End Date Taking? Authorizing Provider  aspirin  EC 81 MG tablet Take 1 tablet (81 mg total) by mouth daily. Swallow whole. 06/26/22  Yes Consuelo Denmark, MD  Dulaglutide (TRULICITY Williamson) Inject into the skin.   Yes [provider]  Empagliflozin -linaGLIPtin (GLYXAMBI) 25-5 MG TABS Take 25 tablets by mouth daily.   Yes [provider]  gabapentin (NEURONTIN) 100 MG capsule Take 1 capsule (100 mg total) by mouth 3 (three) times daily as needed. 10/16/23 11/15/23 Yes Adison Reifsteck, Janalyn Me, MD  lisinopril -hydrochlorothiazide  (PRINZIDE ,ZESTORETIC ) 20-25 MG per tablet TAKE ONE TABLET BY MOUTH ONCE EVERY DAY Patient taking differently: Take 1 tablet by mouth 2 (two) times daily. 01/09/13  Yes Areta Kuba, MD  potassium chloride  (KLOR-CON ) 10 MEQ tablet Take 1 tablet (10 mEq total) by mouth daily. 10/16/23 11/15/23 Yes Peggie Hornak, Janalyn Me, MD  rosuvastatin  (CRESTOR ) 20 MG tablet Take 1 tablet (20 mg total) by mouth daily. 06/26/22  Yes Consuelo Denmark, MD      Allergies    Patient has no known allergies.    Review of Systems   Review of Systems  Physical Exam Updated Vital Signs BP 111/80 (BP Location: Right Arm)   Pulse 64   Temp 98 F (36.7 C) (Oral)    Resp 16   Ht 5' 10 (1.778 m)   Wt 103.9 kg   SpO2 95%   BMI 32.86 kg/m  Physical Exam Constitutional:      General: He is not in acute distress. HENT:     Head: Normocephalic and atraumatic.  Eyes:     Conjunctiva/sclera: Conjunctivae normal.     Pupils: Pupils are equal, round, and reactive to light.  Cardiovascular:     Rate and Rhythm: Normal rate and regular rhythm.  Pulmonary:     Effort: Pulmonary effort is normal. No respiratory distress.  Abdominal:     General: There is no distension.     Tenderness: There is no abdominal tenderness.  Skin:    General: Skin is warm and dry.  Neurological:     Mental Status: He is alert.     Comments: Right eye with right lower quadrianopsia +Pronator drift right arm Paresthesias right face and arm 5/5 strength upper extremities 4/5 strength right lower extremity, 5/5 strength left lower extremity Fully oriented, speech is clear  Psychiatric:        Mood and Affect: Mood normal.        Behavior: Behavior normal.     ED Results / Procedures / Treatments   Labs (all labs ordered are listed, but only abnormal results are displayed) Labs Reviewed  COMPREHENSIVE METABOLIC PANEL WITH GFR - Abnormal; Notable for the following components:      Result Value  Potassium 3.0 (*)    Glucose, Bld 141 (*)    All other components within normal limits  CBG MONITORING, ED - Abnormal; Notable for the following components:   Glucose-Capillary 141 (*)    All other components within normal limits  I-STAT CHEM 8, ED - Abnormal; Notable for the following components:   Potassium 3.1 (*)    Glucose, Bld 140 (*)    Calcium , Ion 1.13 (*)    All other components within normal limits  ETHANOL  PROTIME-INR  APTT  CBC  DIFFERENTIAL  RAPID URINE DRUG SCREEN, HOSP PERFORMED  MAGNESIUM    EKG EKG Interpretation Date/Time:  Wednesday October 16 2023 09:01:57 EDT Ventricular Rate:  79 PR Interval:  191 QRS Duration:  103 QT  Interval:  393 QTC Calculation: 451 R Axis:   22  Text Interpretation: Sinus rhythm Confirmed by Jerald Molly [phone]) on 10/16/2023 9:12:28 AM  Radiology MR BRAIN WO CONTRAST Result Date: 10/16/2023 CLINICAL DATA:  Code stroke. 53 year old male.  Right side deficits. EXAM: MRI HEAD WITHOUT CONTRAST TECHNIQUE: Multiplanar, multiecho pulse sequences of the brain and surrounding structures were obtained without intravenous contrast. COMPARISON:  Head CT, CTA head and neck today. Brain MRI 06/26/2022. FINDINGS: Brain: No restricted diffusion to suggest acute infarction. No midline shift, mass effect, evidence of mass lesion, ventriculomegaly, extra-axial collection or acute intracranial hemorrhage. Cervicomedullary junction and pituitary are within normal limits. Stable gray and white matter signal throughout the brain. Scattered small, subcentimeter and mostly subcortical white matter T2 and FLAIR hyperintensity appears stable from the MRI last year. No cortical encephalomalacia. No chronic cerebral blood products identified. Deep gray nuclei, brain stem and cerebellum appear stable and negative. Vascular: Major intracranial vascular flow voids are stable from last year. Skull and upper cervical spine: Stable. Visualized bone marrow signal is within normal limits. Sinuses/Orbits: Stable and negative. Other: Visible internal auditory structures appear normal. Mastoids remain clear. Negative visible scalp and face. Preliminary report of this exam discussed by telephone with Dr. Ann Keto on 10/16/2023 at 08:53 . IMPRESSION: 1. No acute intracranial abnormality. 2. Stable noncontrast brain MRI with mild for age nonspecific subcortical white matter signal changes. Electronically Signed   By: Marlise Simpers M.D.   On: 10/16/2023 09:05   CT ANGIO HEAD NECK W WO CM (CODE STROKE) Result Date: 10/16/2023 CLINICAL DATA:  52 year old male code stroke presentation. Right side deficits. EXAM: CT ANGIOGRAPHY HEAD AND  NECK TECHNIQUE: Multidetector CT imaging of the head and neck was performed using the standard protocol during bolus administration of intravenous contrast. Multiplanar CT image reconstructions and MIPs were obtained to evaluate the vascular anatomy. Carotid stenosis measurements (when applicable) are obtained utilizing NASCET criteria, using the distal internal carotid diameter as the denominator. RADIATION DOSE REDUCTION: This exam was performed according to the departmental dose-optimization program which includes automated exposure control, adjustment of the mA and/or kV according to patient size and/or use of iterative reconstruction technique. CONTRAST:  75mL OMNIPAQUE  IOHEXOL  350 MG/ML SOLN COMPARISON:  Head CT 0820 hours today. Prior CTA head and neck 06/25/2022. FINDINGS: CTA NECK Skeleton: Stable visualized osseous structures. Chronic cervical spine degeneration. Upper chest: Stable, negative. Other neck: Nonvascular neck soft tissue spaces appear stable and negative. Aortic arch: 3 vessel arch.  Calcified aortic atherosclerosis. Right carotid system: Patent right CCA with no significant plaque. Calcified plaque at the right ICA origin is moderate, stable from last year. Fifty % stenosis with respect to the distal vessel appears stable. Right ICA remains patent  to the skull base. Left carotid system: Left CCA origin, proximal left ICA soft and calcified plaque is mild to moderate with no significant stenosis. Vertebral arteries: Chronic calcified plaque at the right subclavian artery origin does not appear hemodynamically significant. Right vertebral artery origin is patent and within normal limits. Right vertebral artery remains patent, affected by chronic bulky cervical spine degeneration in the V2 segment. Mild right V3 segment calcified plaque is chronic. No significant stenosis to the skull base. Proximal left subclavian artery mild soft calcified plaque left stenosis. Left vertebral artery origin is  normal. Codominant left vertebral artery is patent to the skull base. Mild left V3 calcified plaque. No significant stenosis. CTA HEAD Posterior circulation: Patent distal vertebral arteries and vertebrobasilar junction without significant stenosis. Multifocal calcified left V4 segment plaque. Right V4 segment relatively spared. Patent basilar artery without stenosis. Patent SCA and PCA origins. Bilateral PCA branches appear stable with mild irregularity. Anterior circulation: Both ICA siphons are patent. Bilateral chronic ICA siphon calcified plaque. No hemodynamically significant stenosis on the left. Right cavernous segment bulky calcified plaque and moderate stenosis appears stable from last year. Less pronounced right ICA supraclinoid stenosis. Patent carotid termini. Patent MCA and ACA origins. Bilateral ECA branches are stable and within normal limits. Right MCA M1 segment and bifurcation are patent without stenosis. Right MCA branches appear stable when allowing for suboptimal contrast timing today. Left MCA M1 segment and bifurcation appear patent without stenosis. Left MCA branches are stable when allowing for less optimal contrast timing today. Venous sinuses: Patent. Anatomic variants: None significant. IMPRESSION: 1. Negative for large vessel occlusion. 2. Stable CTA head and neck since 2024: - Right > left proximal ICA and ICA siphon atherosclerosis. 50% stenosis right ICA origin, and moderate stenosis cavernous right ICA siphon. - no other hemodynamically significant stenosis. 3.  Aortic Atherosclerosis (ICD10-I70.0). Preliminary report of negative LVO discussed by telephone with Dr. Ann Keto on 10/16/2023 at 08:53 . Electronically Signed   By: Marlise Simpers M.D.   On: 10/16/2023 09:01   CT HEAD CODE STROKE WO CONTRAST Result Date: 10/16/2023 CLINICAL DATA:  Code stroke.  54 year old male. EXAM: CT HEAD WITHOUT CONTRAST TECHNIQUE: Contiguous axial images were obtained from the base of the skull  through the vertex without intravenous contrast. RADIATION DOSE REDUCTION: This exam was performed according to the departmental dose-optimization program which includes automated exposure control, adjustment of the mA and/or kV according to patient size and/or use of iterative reconstruction technique. COMPARISON:  Brain MRI 06/26/2022.  Head CT 06/25/2022. FINDINGS: Brain: Cerebral volume remains normal. No midline shift, ventriculomegaly, mass effect, evidence of mass lesion, intracranial hemorrhage or evidence of cortically based acute infarction. Gray-white matter differentiation is within normal limits throughout the brain. Vascular: Calcified atherosclerosis at the skull base. No suspicious intracranial vascular hyperdensity. Skull: No acute osseous abnormality identified. Sinuses/Orbits: Visualized paranasal sinuses and mastoids are stable and well aerated. Other: No gaze deviation. No acute orbit or scalp soft tissue finding. ASPECTS [facility] Stroke Program Early CT Score) Total score (0-10 with 10 being normal): 10 IMPRESSION: 1. Stable and normal for age noncontrast CT appearance of the brain. ASPECTS 10. 2. These results were communicated to Dr. Alecia Ames at 8:31 am on 10/16/2023 by text page via the [facility] messaging system. Electronically Signed   By: Marlise Simpers M.D.   On: 10/16/2023 08:32    Procedures .Critical Care  Performed by: Arvilla Birmingham, MD Authorized by: Arvilla Birmingham, MD   Critical care provider statement:  Critical care time (minutes):  45   Critical care time was exclusive of:  Separately billable procedures and treating other patients   Critical care was necessary to treat or prevent imminent or life-threatening deterioration of the following conditions:  CNS failure or compromise and metabolic crisis   Critical care was time spent personally by me on the following activities:  Ordering and performing treatments and interventions, ordering and review of laboratory  studies, ordering and review of radiographic studies, pulse oximetry, review of old charts, examination of patient and evaluation of patient's response to treatment Comments:     Possible stroke evaluation; neurology consult; stat imaging and neuro rechecks Hypokalemia repletion     Medications Ordered in ED Medications  iohexol  (OMNIPAQUE ) 350 MG/ML injection 75 mL (75 mLs Intravenous Contrast Given 10/16/23 0825)  metoCLOPramide (REGLAN) injection 10 mg (10 mg Intravenous Given 10/16/23 0914)  potassium chloride  10 mEq in 100 mL IVPB (0 mEq Intravenous Stopped 10/16/23 1134)  potassium chloride  SA (KLOR-CON  M) CR tablet 40 mEq (40 mEq Oral Given 10/16/23 0915)  valproate (DEPACON) 1,000 mg in dextrose 5 % 50 mL IVPB (0 mg Intravenous Stopped 10/16/23 1321)  magnesium sulfate IVPB 2 g 50 mL (0 g Intravenous Stopped 10/16/23 1542)    ED Course/ Medical Decision Making/ A&P Clinical Course as of 10/16/23 1645  Wed Oct 16, 2023  0835 Dr Alecia Ames neurology has evaluated patient.  Given that there are no acute findings on CT imaging, the patient has had multiple presentations very similar to this over the past several years, the neurologist did not feel that the patient would benefit from thrombolytics, therefore no TNKase  administered.  Patient is pending an MRI of the brain, and if negative, neurologist recommending treatment for possible complex migraine [MT]  0900 No acute findings on MRI per my discussion of the neurologist.  Neurology recommending treatment for potential complex migraine, Reglan has been ordered.  Will also replete potassium levels.  Code stroke cancelled per neurology - no further stroke workup indicated at this time.   [MT]  1117 Patient is reassessed.  He has had improvement of his vision back to baseline.  He is also improvement of the tingling of his lips as well as his right hand, though he continues to have paresthesias there.  I will next give the patient IV Decapon 1 gm  and 2 gm Mag IV per my discussion with neurology, continuing to treat for possible complex migraine. [MT]  1357 Symptoms improved, now only having mild tingling in fingers - facial symptoms resolved.  Per neurologist, with patient's improving symptoms, no indication for hospitalization from a neurological standpoint.  He can be discharged with gabapentin as needed for paresthesias and recommended to follow-up with his prior neurologist, for which he sees [facility].  He will also need potassium supplement [MT]  1442 Ambulating steadily.  Discussed outpatient follow up and neuro referral again (to re-establish care) [MT]    Clinical Course User Index [MT] Chantale Leugers, Janalyn Me, MD                                 Medical Decision Making Amount and/or Complexity of Data Reviewed Labs: ordered.  Risk Prescription drug management.   This patient presents to the ED with concern for right sided paresthesias. This involves an extensive number of treatment options, and is a complaint that carries with it a high risk of complications and morbidity.  The differential diagnosis includes CVA vs TIA vs metabolic derangement vs complex migraine vs other   I ordered and personally interpreted labs.  The pertinent results include:  K 3.0.  No emergent findings  I ordered imaging studies including CT and MRI imaging I independently visualized and interpreted imaging which showed no acute stroke, moderate ICA stenosis I agree with the radiologist interpretation  The patient was maintained on a cardiac monitor.  I personally viewed and interpreted the cardiac monitored which showed an underlying rhythm of: NSR  Per my interpretation the patient's ECG shows no acute ischemic findings  I ordered medication including potassium repletion for hypoK; complex migraine medications  I have reviewed the patients home medicines and have made adjustments as needed  Test Considered: doubt aortic dissection, PNA,  meningitis, sepsis, critical ischemia  I requested consultation with the neurologist,  and discussed lab and imaging findings as well as pertinent plan - they recommend: see ed course  After the interventions noted above, I reevaluated the patient and found that they have: improved  Dispostion:  After consideration of the diagnostic results and the patients response to treatment, I feel that the patent would benefit from outpatient referral and follow up with neurologist.         Final Clinical Impression(s) / ED Diagnoses Final diagnoses:  Paresthesia  Hypokalemia    Rx / DC Orders ED Discharge Orders          Ordered    Ambulatory referral to Neurology       Comments: An appointment is requested in approximately: 1 week Right sided paresthesias - former patient of [facility] neuro to re-establish care   10/16/23 1441    gabapentin (NEURONTIN) 100 MG capsule  3 times daily PRN        10/16/23 1442    potassium chloride  (KLOR-CON ) 10 MEQ tablet  Daily        10/16/23 1442              Arvilla Birmingham, MD 10/16/23 1645

## 2023-10-16 NOTE — ED Triage Notes (Signed)
 Pt arrived by [facility] for Code Stroke. Pt was at work when he started to have tingling in the right side of his face and weakness on the right side. Pt has had the same hx in the past.   Symptoms started @ 0732 on 6/11 Code stroke called in the field.

## 2024-06-05 ENCOUNTER — Other Ambulatory Visit: Payer: Self-pay

## 2024-06-05 ENCOUNTER — Encounter (HOSPITAL_COMMUNITY): Payer: Self-pay

## 2024-06-05 ENCOUNTER — Emergency Department (HOSPITAL_COMMUNITY)
Admission: EM | Admit: 2024-06-05 | Discharge: 2024-06-05 | Disposition: A | Discharge status: Home / Self Care | Attending: Emergency Medicine | Admitting: Emergency Medicine

## 2024-06-05 ENCOUNTER — Emergency Department (HOSPITAL_COMMUNITY)

## 2024-06-05 DIAGNOSIS — R002 Palpitations: Secondary | ICD-10-CM | POA: Insufficient documentation

## 2024-06-05 DIAGNOSIS — R42 Dizziness and giddiness: Secondary | ICD-10-CM | POA: Insufficient documentation

## 2024-06-05 DIAGNOSIS — R0789 Other chest pain: Secondary | ICD-10-CM | POA: Insufficient documentation

## 2024-06-05 DIAGNOSIS — R079 Chest pain, unspecified: Secondary | ICD-10-CM

## 2024-06-05 DIAGNOSIS — Z7982 Long term (current) use of aspirin: Secondary | ICD-10-CM | POA: Diagnosis not present

## 2024-06-05 LAB — CBC WITH DIFFERENTIAL/PLATELET
Abs Immature Granulocytes: 0.04 10*3/uL (ref 0.00–0.07)
Basophils Absolute: 0.1 10*3/uL (ref 0.0–0.1)
Basophils Relative: 1 %
Eosinophils Absolute: 0.2 10*3/uL (ref 0.0–0.5)
Eosinophils Relative: 2 %
HCT: 49.5 % (ref 39.0–52.0)
Hemoglobin: 16.5 g/dL (ref 13.0–17.0)
Immature Granulocytes: 0 %
Lymphocytes Relative: 30 %
Lymphs Abs: 2.9 10*3/uL (ref 0.7–4.0)
MCH: 31.4 pg (ref 26.0–34.0)
MCHC: 33.3 g/dL (ref 30.0–36.0)
MCV: 94.1 fL (ref 80.0–100.0)
Monocytes Absolute: 0.9 10*3/uL (ref 0.1–1.0)
Monocytes Relative: 9 %
Neutro Abs: 5.6 10*3/uL (ref 1.7–7.7)
Neutrophils Relative %: 58 %
Platelets: 176 10*3/uL (ref 150–400)
RBC: 5.26 MIL/uL (ref 4.22–5.81)
RDW: 12.9 % (ref 11.5–15.5)
WBC: 9.6 10*3/uL (ref 4.0–10.5)
nRBC: 0 % (ref 0.0–0.2)

## 2024-06-05 LAB — COMPREHENSIVE METABOLIC PANEL WITH GFR
ALT: 22 U/L (ref 0–44)
AST: 11 U/L — ABNORMAL LOW (ref 15–41)
Albumin: 4.4 g/dL (ref 3.5–5.0)
Alkaline Phosphatase: 51 U/L (ref 38–126)
Anion gap: 15 (ref 5–15)
BUN: 17 mg/dL (ref 6–20)
CO2: 23 mmol/L (ref 22–32)
Calcium: 9.6 mg/dL (ref 8.9–10.3)
Chloride: 102 mmol/L (ref 98–111)
Creatinine, Ser: 0.74 mg/dL (ref 0.61–1.24)
GFR, Estimated: >60 mL/min
Glucose, Bld: 181 mg/dL — ABNORMAL HIGH (ref 70–99)
Potassium: 3.6 mmol/L (ref 3.5–5.1)
Sodium: 140 mmol/L (ref 135–145)
Total Bilirubin: 0.3 mg/dL (ref 0.0–1.2)
Total Protein: 7 g/dL (ref 6.5–8.1)

## 2024-06-05 LAB — TROPONIN T, HIGH SENSITIVITY
Troponin T High Sensitivity: 8 ng/L (ref 0–19)
Troponin T High Sensitivity: 7 ng/L (ref 0–19)

## 2024-06-05 LAB — MAGNESIUM: Magnesium: 1.8 mg/dL (ref 1.7–2.4)

## 2024-06-05 NOTE — ED Notes (Signed)
 Written and verbal discharge instructions administered. Pt and spouse deny any questions or concerns at this time. Pt strongly encouraged to follow up with cardiology. Pt educated on A and P of cardiac system and rationale on importance of follow up. Pt and spouse verbalized understanding. IV discontinued. Cath intact. Bleeding controlled.

## 2024-06-05 NOTE — ED Notes (Signed)
 Received report. Pt resting and denies pain. Nad. Color wnl. Non diaphoretic.

## 2024-06-05 NOTE — ED Triage Notes (Signed)
 Pt to ED for palpitations, began 1 hr ago. Pt states I feel like my heart is fluttering and then I feel dizzy. Endorses left sided chest pain, dull, that radiates to left shoulder.

## 2024-06-05 NOTE — ED Provider Notes (Signed)
 " [place] [facility]MENT AT [facility] Provider Note   CSN: [id number] Arrival date & time: 06/05/24  9567     Patient presents with: Chest Pain   Shaun Williams is a 54 y.o. male.   Patient presents to the emergency department for evaluation of chest discomfort and palpitations.  He reports that he felt like his heart was consistently fluttering and he had some heaviness in his chest associated with dizziness.  Pain is dull, radiates to the left shoulder.  Patient no longer feeling the palpitations.  He reports that he has had occasional flutters in the past but nothing like this.  No known heart disease.       Prior to Admission medications  Medication Sig Start Date End Date Taking? Authorizing Provider  aspirin  EC 81 MG tablet Take 1 tablet (81 mg total) by mouth daily. Swallow whole. 06/26/22   Jerri Pfeiffer, MD  Dulaglutide (TRULICITY ) Inject into the skin.    [provider]  Empagliflozin -linaGLIPtin (GLYXAMBI) 25-5 MG TABS Take 25 tablets by mouth daily.    [provider]  gabapentin  (NEURONTIN ) 100 MG capsule Take 1 capsule (100 mg total) by mouth 3 (three) times daily as needed. 10/16/23 11/15/23  Cottie Donnice PARAS, MD  lisinopril -hydrochlorothiazide  (PRINZIDE ,ZESTORETIC ) 20-25 MG per tablet TAKE ONE TABLET BY MOUTH ONCE EVERY DAY Patient taking differently: Take 1 tablet by mouth 2 (two) times daily. 01/09/13   Alphonsa Elsie RAMAN, MD  potassium chloride  (KLOR-CON ) 10 MEQ tablet Take 1 tablet (10 mEq total) by mouth daily. 10/16/23 11/15/23  Cottie Donnice PARAS, MD  rosuvastatin  (CRESTOR ) 20 MG tablet Take 1 tablet (20 mg total) by mouth daily. 06/26/22   Jerri Pfeiffer, MD    Allergies: Patient has no known allergies.    Review of Systems  Updated Vital Signs BP 104/83   Pulse 69   Temp 98.6 F (37 C) (Oral)   Resp 16   Ht 5' 10 (1.778 m)   Wt 101.2 kg   SpO2 97%   BMI 32.00 kg/m   Physical Exam Vitals and nursing note  reviewed.  Constitutional:      General: He is not in acute distress.    Appearance: He is well-developed.  HENT:     Head: Normocephalic and atraumatic.     Mouth/Throat:     Mouth: Mucous membranes are moist.  Eyes:     General: Vision grossly intact. Gaze aligned appropriately.     Extraocular Movements: Extraocular movements intact.     Conjunctiva/sclera: Conjunctivae normal.  Cardiovascular:     Rate and Rhythm: Normal rate and regular rhythm.     Pulses: Normal pulses.     Heart sounds: Normal heart sounds, S1 normal and S2 normal. No murmur heard.    No friction rub. No gallop.  Pulmonary:     Effort: Pulmonary effort is normal. No respiratory distress.     Breath sounds: Normal breath sounds.  Abdominal:     Palpations: Abdomen is soft.     Tenderness: There is no abdominal tenderness. There is no guarding or rebound.     Hernia: No hernia is present.  Musculoskeletal:        General: No swelling.     Cervical back: Full passive range of motion without pain, normal range of motion and neck supple. No pain with movement, spinous process tenderness or muscular tenderness. Normal range of motion.     Right lower leg: No edema.  Left lower leg: No edema.  Skin:    General: Skin is warm and dry.     Capillary Refill: Capillary refill takes less than 2 seconds.     Findings: No ecchymosis, erythema, lesion or wound.  Neurological:     Mental Status: He is alert and oriented to person, place, and time.     GCS: GCS eye subscore is 4. GCS verbal subscore is 5. GCS motor subscore is 6.     Cranial Nerves: Cranial nerves 2-12 are intact.     Sensory: Sensation is intact.     Motor: Motor function is intact. No weakness or abnormal muscle tone.     Coordination: Coordination is intact.  Psychiatric:        Mood and Affect: Mood normal.        Speech: Speech normal.        Behavior: Behavior normal.     (all labs ordered are listed, but only abnormal results are  displayed) Labs Reviewed  COMPREHENSIVE METABOLIC PANEL WITH GFR - Abnormal; Notable for the following components:      Result Value   Glucose, Bld 181 (*)    AST 11 (*)    All other components within normal limits  CBC WITH DIFFERENTIAL/PLATELET  MAGNESIUM   TROPONIN T, HIGH SENSITIVITY  TROPONIN T, HIGH SENSITIVITY    EKG: EKG Interpretation Date/Time:  Friday June 05 2024 04:47:16 EST Ventricular Rate:  76 PR Interval:  173 QRS Duration:  95 QT Interval:  391 QTC Calculation: 440 R Axis:   34  Text Interpretation: Sinus rhythm Normal ECG Confirmed by Haze Lonni PARAS ([id number]) on 06/05/2024 4:51:05 AM  Radiology: [facility] Chest Port 1 View Result Date: 06/05/2024 EXAM: 1 VIEW(S) XRAY OF THE CHEST 06/05/2024 05:18:00 AM COMPARISON: PA and lateral chest 12/29/2008. CLINICAL HISTORY: left-sided chest pain and discomfort with slight dizziness. FINDINGS: LINES, TUBES AND DEVICES: Multiple overlying telemetry leads. LUNGS AND PLEURA: No focal pulmonary opacity. No pleural effusion. No pneumothorax. HEART AND MEDIASTINUM: Calcification of the transverse aorta is present. The cardiac silhouette is normal in size. The mediastinal silhouette is stable. No vascular congestion is seen. BONES AND SOFT TISSUES: No acute osseous abnormality. Thoracic spondylosis is noted. IMPRESSION: 1. No acute cardiopulmonary abnormality. 2. Aortic atherosclerosis. Electronically signed by: Francis Quam MD 06/05/2024 05:26 AM EST RP Workstation: [id number]     Procedures   Medications Ordered in the ED - No data to display              HEART Score: 3                    Medical Decision Making Amount and/or Complexity of Data Reviewed Labs: ordered. Radiology: ordered.   Differential Diagnosis considered includes, but not limited to: STEMI; NSTEMI; myocarditis; pericarditis; arrhythmia; pulmonary embolism; aortic dissection; pneumothorax; pneumonia; gastritis; musculoskeletal pain  Presents to  the Emergency Department for evaluation of fluttering in his chest and chest discomfort that began while asleep.  Patient's EKG is normal here in the ED.  He has been placed on cardiac telemetry and there has not been any noted arrhythmia or significant ectopy.  Lab work is unremarkable otherwise.  First troponin negative at 8.  Will sign out to oncoming ER physician, will need to evaluate after second troponin.  If negative, likely can be discharged with prompt cardiology follow-up.     Final diagnoses:  Chest pain, unspecified type  Palpitations    ED Discharge Orders  None          Haze Lonni PARAS, MD 06/05/24 [phone]  "
